# Patient Record
Sex: Male | Born: 1960 | Race: Black or African American | Hispanic: No | Marital: Single | Smoking: Current every day smoker
Health system: Southern US, Community
[De-identification: ages and names within clinical notes are randomized; demographics above are authoritative.]

## PROBLEM LIST (undated history)

## (undated) DIAGNOSIS — R519 Headache, unspecified: Secondary | ICD-10-CM

## (undated) DIAGNOSIS — I10 Essential (primary) hypertension: Secondary | ICD-10-CM

## (undated) DIAGNOSIS — R51 Headache: Secondary | ICD-10-CM

## (undated) DIAGNOSIS — F32A Depression, unspecified: Secondary | ICD-10-CM

## (undated) DIAGNOSIS — K219 Gastro-esophageal reflux disease without esophagitis: Secondary | ICD-10-CM

## (undated) DIAGNOSIS — Z9109 Other allergy status, other than to drugs and biological substances: Secondary | ICD-10-CM

## (undated) DIAGNOSIS — F329 Major depressive disorder, single episode, unspecified: Secondary | ICD-10-CM

## (undated) DIAGNOSIS — M1990 Unspecified osteoarthritis, unspecified site: Secondary | ICD-10-CM

## (undated) DIAGNOSIS — G4730 Sleep apnea, unspecified: Secondary | ICD-10-CM

## (undated) HISTORY — PX: COLONOSCOPY: SHX174

## (undated) HISTORY — PX: BACK SURGERY: SHX140

---

## 2007-04-10 ENCOUNTER — Encounter: Admission: RE | Admit: 2007-04-10 | Discharge: 2007-04-10 | Payer: Self-pay | Admitting: Neurosurgery

## 2007-04-30 ENCOUNTER — Inpatient Hospital Stay (HOSPITAL_COMMUNITY): Admission: RE | Admit: 2007-04-30 | Discharge: 2007-05-02 | Payer: Self-pay | Admitting: Neurosurgery

## 2007-05-06 ENCOUNTER — Emergency Department (HOSPITAL_COMMUNITY): Admission: EM | Admit: 2007-05-06 | Discharge: 2007-05-06 | Payer: Self-pay | Admitting: Emergency Medicine

## 2007-12-03 ENCOUNTER — Ambulatory Visit (HOSPITAL_COMMUNITY): Admission: RE | Admit: 2007-12-03 | Discharge: 2007-12-03 | Payer: Self-pay | Admitting: Orthopedic Surgery

## 2007-12-16 ENCOUNTER — Encounter: Admission: RE | Admit: 2007-12-16 | Discharge: 2007-12-16 | Payer: Self-pay | Admitting: Orthopedic Surgery

## 2008-03-22 ENCOUNTER — Encounter: Admission: RE | Admit: 2008-03-22 | Discharge: 2008-03-22 | Payer: Self-pay | Admitting: Neurosurgery

## 2010-09-02 ENCOUNTER — Encounter: Payer: Self-pay | Admitting: Neurosurgery

## 2010-12-24 NOTE — Op Note (Signed)
Jonathan Huber, Jonathan Huber              ACCOUNT NO.:  [id number]   MEDICAL RECORD NO.:  [id number]          PATIENT TYPE:  INP   LOCATION:  3172                         FACILITY:  [facility]   PHYSICIAN:  Reinaldo Meeker, M.D. DATE OF BIRTH:  1960-12-24   DATE OF PROCEDURE:  04/30/2007  DATE OF DISCHARGE:                               OPERATIVE REPORT   PREOPERATIVE DIAGNOSIS:  Spondylolisthesis with spondylolysis L4-5.   POSTOPERATIVE DIAGNOSIS:  Spondylolisthesis with spondylolysis L4-5.   PROCEDURE:  L4-5 Gill procedure with posterior lumbar interbody fusion  with invasive bony spacer and PEEK interbody cage, followed by L4-5  nonsegmental pedicle screw instrumentation followed by L4-5  posterolateral fusion.   SECONDARY PROCEDURE:  Microsection L4-L5 nerve roots and L4-5 disk  bilaterally.   SURGEON:  Reinaldo Meeker, M.D.   ASSISTANT:  Dr. Donalee Citrin.   PROCEDURE IN DETAIL:  After being placed in the prone position, the  patient's back was prepped and draped in usual sterile fashion.  Localizing fluoroscopy was used prior to incision.  Midline incision was  then made over the spinous process of L3, L4-L5.  Using Bovie cutting  current, the incision was carried down the spinous processes.  Subperiosteal dissection was then carried out bilaterally on the spinous  processes, lamina of facet joint and to the far lateral region to  identify the transverse processes of L4-L5 bilaterally.  Self retractor  was placed for exposure and x-ray showed approach to the appropriate  level.  Using the [facility] rongeur spinous process, interspinous ligaments  of L4-L5 were removed.  Started the patient's left side, free floating  lamina of L4 was removed along with the medial superior facet of L5.  L4-  L5 nerve roots were both identified and tracked out their lamina.  Midline structures were completely removed to complete the bilateral  decompression.  At this time, bilateral microdiskectomy was  carried out.  Thorough disk space clean-out was carried out, while at the same time  great care was taken to avoid injury to the neural elements.  This was  successfully done.  Bilateral L4-L5 nerve roots were found to be well  decompressed after the diskectomy.  At this time the disk space was  prepared for posterior lumbar interbody fusion.  A 12 mm distractor was  placed, found to be a good fit.  Rotating cutter followed by box chisel  was carried out bilaterally.  On one side an invasive bony spacer was  placed, on the opposite side a PEEK cage filled with autologous bone,  Actifuse and BMP was placed.  Prior to placing the second spacer,  autologous bone, Actifuse and BMP were placed in the midline to help  with the interbody fusion.  Fluoroscopy showed the devices to be in good  apposition at the interspace.  At this time pedicle screw fixation was  carried out.  Under fluoroscopic guidance, drill hole entry point was  made followed by using awl, a tap and placing of 40 mm screw on one side  at L4 and 45 mm screws on the left at L4 and 40 mm screws  were placed L5  bilaterally.  Fluoroscopy showed this to be in good position along with  palpation of the medial pedicle to confirm no breech of the pedicle.  Large amounts of irrigation were carried at this time.  High-speed drill  was used to decorticate the transverse processes and posterolateral  fusion was carried out by placing BMP autologous bone and Actifuse  bilaterally.  An appropriate length rod was then chosen and secured to  the pedicle screws.  After the L5 screws had been final tightened,  compression was carried out at L4 down to L5 and then final tightening  was done at L4.  Final fluoroscopy showed at this time the screws, rod  and interbody devices to all be in good position.  Large amounts of  irrigation were carried at this time once more.  Any bleeding controlled  with bipolar coagulation and Gelfoam.  Epidural drain  was left in the  epidural space and brought out through a  separate stab wound incision.  Wound was then closed in multiple layers  of Vicryl in the muscle fascia, subcutaneous and subcuticular tissues  and staples were placed on the skin.  Sterile dressing was applied.  The  patient was extubated and taken to the recovery room in stable  condition.           ______________________________  Reinaldo Meeker, M.D.     ROK/MEDQ  D:  04/30/2007  T:  04/30/2007  Job:  [id number]

## 2011-05-22 LAB — CBC
HCT: 45.3
Hemoglobin: 15.9
MCHC: 34.7
MCHC: 35
MCV: 88.3
Platelets: 362
RBC: 4.52
RDW: 13.8

## 2011-05-22 LAB — I-STAT 8, (EC8 V) (CONVERTED LAB)
BUN: 8
Chloride: 104
Glucose, Bld: 102 — ABNORMAL HIGH
Potassium: 4.1
pH, Ven: 7.361 — ABNORMAL HIGH

## 2011-05-22 LAB — DIFFERENTIAL
Basophils Relative: 0
Eosinophils Absolute: 0.1
Eosinophils Relative: 1
Monocytes Relative: 6
Neutrophils Relative %: 67

## 2011-05-22 LAB — URINALYSIS, ROUTINE W REFLEX MICROSCOPIC
Hgb urine dipstick: NEGATIVE
Ketones, ur: NEGATIVE
Protein, ur: NEGATIVE
Urobilinogen, UA: 1

## 2011-05-22 LAB — POCT I-STAT CREATININE: Operator id: 277751

## 2014-06-06 ENCOUNTER — Other Ambulatory Visit: Payer: Self-pay | Admitting: Neurosurgery

## 2014-06-06 DIAGNOSIS — M5412 Radiculopathy, cervical region: Secondary | ICD-10-CM

## 2014-06-14 ENCOUNTER — Ambulatory Visit
Admission: RE | Admit: 2014-06-14 | Discharge: 2014-06-14 | Disposition: A | Discharge status: Home / Self Care | Payer: Medicare Other | Source: Ambulatory Visit | Attending: Neurosurgery | Admitting: Neurosurgery

## 2014-06-14 DIAGNOSIS — M5412 Radiculopathy, cervical region: Secondary | ICD-10-CM

## 2014-10-04 ENCOUNTER — Other Ambulatory Visit: Payer: Self-pay | Admitting: Neurosurgery

## 2014-10-04 DIAGNOSIS — M5416 Radiculopathy, lumbar region: Secondary | ICD-10-CM

## 2014-10-20 ENCOUNTER — Other Ambulatory Visit: Payer: Medicare Other

## 2014-10-23 ENCOUNTER — Ambulatory Visit
Admission: RE | Admit: 2014-10-23 | Discharge: 2014-10-23 | Disposition: A | Discharge status: Home / Self Care | Payer: Medicare Other | Source: Ambulatory Visit | Attending: Neurosurgery | Admitting: Neurosurgery

## 2014-10-23 DIAGNOSIS — M5416 Radiculopathy, lumbar region: Secondary | ICD-10-CM

## 2014-10-23 MED ORDER — GADOBENATE DIMEGLUMINE 529 MG/ML IV SOLN
17.0000 mL | Freq: Once | INTRAVENOUS | Status: AC | PRN
  Administered 2014-10-23: 17 mL via INTRAVENOUS

## 2014-10-24 ENCOUNTER — Other Ambulatory Visit: Payer: Self-pay | Admitting: Neurosurgery

## 2014-10-24 DIAGNOSIS — M5416 Radiculopathy, lumbar region: Secondary | ICD-10-CM

## 2014-10-27 ENCOUNTER — Ambulatory Visit
Admission: RE | Admit: 2014-10-27 | Discharge: 2014-10-27 | Disposition: A | Discharge status: Home / Self Care | Payer: Medicare Other | Source: Ambulatory Visit | Attending: Neurosurgery | Admitting: Neurosurgery

## 2014-10-27 DIAGNOSIS — M5416 Radiculopathy, lumbar region: Secondary | ICD-10-CM

## 2014-10-27 MED ORDER — IOHEXOL 180 MG/ML  SOLN
1.0000 mL | Freq: Once | INTRAMUSCULAR | Status: AC | PRN
  Administered 2014-10-27: 1 mL via EPIDURAL

## 2014-10-27 MED ORDER — METHYLPREDNISOLONE ACETATE 40 MG/ML INJ SUSP (RADIOLOG
120.0000 mg | Freq: Once | INTRAMUSCULAR | Status: AC
  Administered 2014-10-27: 120 mg via EPIDURAL

## 2014-10-27 NOTE — Discharge Instructions (Signed)

## 2014-11-01 DIAGNOSIS — M25551 Pain in right hip: Secondary | ICD-10-CM | POA: Diagnosis present

## 2014-11-01 DIAGNOSIS — M5417 Radiculopathy, lumbosacral region: Secondary | ICD-10-CM | POA: Diagnosis not present

## 2014-11-01 DIAGNOSIS — Z720 Tobacco use: Secondary | ICD-10-CM | POA: Diagnosis not present

## 2014-11-01 DIAGNOSIS — R000 Tachycardia, unspecified: Secondary | ICD-10-CM | POA: Diagnosis not present

## 2014-11-01 DIAGNOSIS — Z9889 Other specified postprocedural states: Secondary | ICD-10-CM | POA: Insufficient documentation

## 2014-11-01 DIAGNOSIS — I10 Essential (primary) hypertension: Secondary | ICD-10-CM | POA: Insufficient documentation

## 2014-11-02 ENCOUNTER — Encounter (HOSPITAL_COMMUNITY): Payer: Self-pay | Admitting: Emergency Medicine

## 2014-11-02 ENCOUNTER — Emergency Department (HOSPITAL_COMMUNITY)
Admission: EM | Admit: 2014-11-02 | Discharge: 2014-11-02 | Disposition: A | Discharge status: Home / Self Care | Payer: Medicare Other | Attending: Emergency Medicine | Admitting: Emergency Medicine

## 2014-11-02 DIAGNOSIS — M5417 Radiculopathy, lumbosacral region: Secondary | ICD-10-CM | POA: Diagnosis not present

## 2014-11-02 HISTORY — DX: Essential (primary) hypertension: I10

## 2014-11-02 MED ORDER — METHOCARBAMOL 500 MG PO TABS: 500.0000 mg | ORAL_TABLET | Freq: Two times a day (BID) | ORAL | Status: AC

## 2014-11-02 MED ORDER — TRAMADOL HCL 50 MG PO TABS: 50.0000 mg | ORAL_TABLET | Freq: Four times a day (QID) | ORAL | Status: AC | PRN

## 2014-11-02 MED ORDER — KETOROLAC TROMETHAMINE 60 MG/2ML IM SOLN
60.0000 mg | Freq: Once | INTRAMUSCULAR | Status: AC
Start: 2014-11-02 — End: 2014-11-02
  Administered 2014-11-02: 60 mg via INTRAMUSCULAR
  Filled 2014-11-02: qty 2

## 2014-11-02 MED ORDER — OXYCODONE-ACETAMINOPHEN 5-325 MG PO TABS
2.0000 | ORAL_TABLET | Freq: Once | ORAL | Status: AC
  Administered 2014-11-02: 2 via ORAL
  Filled 2014-11-02: qty 2

## 2014-11-02 NOTE — Discharge Instructions (Signed)

## 2014-11-02 NOTE — ED Notes (Signed)
Pt st's no relief after Toradol IM.  Fayrene HelperBowie Tran, PA made aware.

## 2014-11-02 NOTE — ED Notes (Signed)
Patient here with complaint of right hip pain. States history of lower back surgery which relieved nerve pressure and allowed him to return to function for a number of years. States not no particular injury that he can recall. States pain is similar to pain prior to spinal surgery. Patient has been taking Rx oral pain medications and using topical analgesics with no relief.

## 2014-11-02 NOTE — ED Provider Notes (Signed)
CSN: [id number]     Arrival date & time 11/01/14  2356 History   First MD Initiated Contact with Patient 11/02/14 0038     Chief Complaint  Patient presents with  . Hip Pain     (Consider location/radiation/quality/duration/timing/severity/associated sxs/prior Treatment) HPI   54 year old male who presents for evaluation of right hip pain. Patient reports he has a significant history of back pain with prior back surgery to relieve pressure. States he was symptom-free for approximately 8 years. For the past 2 weeks he has had intermittent right-sided hip pain and low back pain that radiates to the lateral aspects of his right lower leg. Pain is worsened with certain movement, sharp, shooting, and intense that time. States pain sometimes so severe it causes him to fall down on the ground. States he has had 4 separate episodes of fall because of his pain. He denies any specific injury. He was seen by his neurosurgeon, Dr. Gerlene FeeKritzer on 3/14 for this complaint. He did have a low back MRI that shows degenerative changes but no evidence of cord compression. He also had blood work done and states that it was normal. He was discharge with a steroid Dosepak which he currently taking but noticed no improvement. He did attempt to call Dr. Gerlene FeeKritzer for close follow-up but was not given any appointment date. Pain intensified tonight prompting him to come to the ER for further evaluation. He has been taking over-the-counter medication as well as BenGay without adequate relief. No complaints of fever, chills, abdominal pain, bowel bladder incontinence, saddle anesthesia, or rash. Denies any numbness but does endorse weakness to his right lower leg. No specific injury. No prior history of PE or DVT, no recent surgery, prolonged bed rest, calf pain or leg swelling, active cancer or hemoptysis. No chest pain or shortness of breath.  Past Medical History  Diagnosis Date  . Hypertension    Past Surgical History   Procedure Laterality Date  . Back surgery     No family history on file. History  Substance Use Topics  . Smoking status: Current Every Day Smoker -- 0.25 packs/day    Types: Cigarettes  . Smokeless tobacco: Not on file  . Alcohol Use: No    Review of Systems  All other systems reviewed and are negative.     Allergies  Review of patient's allergies indicates no known allergies.  Home Medications   Prior to Admission medications   Not on File   BP 140/109 mmHg  Pulse 119  Temp(Src) 98.5 F (36.9 C) (Oral)  Resp 16  SpO2 96% Physical Exam  Constitutional: He appears well-developed and well-nourished. No distress.  HENT:  Head: Atraumatic.  Eyes: Conjunctivae are normal.  Neck: Normal range of motion. Neck supple.  Cardiovascular:  Tachycardia without murmurs or gallops.  Pulmonary/Chest: Effort normal and breath sounds normal.  Abdominal: Soft. Bowel sounds are normal. He exhibits no distension. There is no tenderness.  Musculoskeletal: He exhibits tenderness (tenderness to right lumbosacral region on palpation and tenderness along the lateral aspects of right thigh, knee, and lower leg without overlying skin changes. Full range of motion throughout lower extremities. Intact distal pulse).  Neurological: He is alert.  Skin: No rash noted.  Psychiatric: He has a normal mood and affect.    ED Course  Procedures (including critical care time)  Patient here with radicular right lower leg pain. He has had prior back surgery with an L4-L5 fusion. He recently had a L-spine MRI performed 10 days  ago for his complaint which did not show evidence of cord compression or nerve impingement. He is able to ambulate, and is neurovascularly intact. He is currently taking a steroid Dosepak. He will need to follow-up promptly with his neurosurgeon for further management. Will provide pain management at this time. Labs Review Labs Reviewed - No data to display  Imaging Review No  results found.   EKG Interpretation None      MDM   Final diagnoses:  Lumbosacral radiculopathy    BP 145/81 mmHg  Pulse 74  Temp(Src) 98.5 F (36.9 C) (Oral)  Resp 18  SpO2 100%     Fayrene Helper, PA-C 11/02/14 0149  Marisa Severin, MD 11/02/14 [phone]

## 2014-11-02 NOTE — ED Notes (Signed)
Pt st's he started having pain in right hip on Sat. Tonight pain started going down right leg.

## 2014-11-03 ENCOUNTER — Other Ambulatory Visit: Payer: Self-pay | Admitting: Neurosurgery

## 2014-11-03 DIAGNOSIS — M5416 Radiculopathy, lumbar region: Secondary | ICD-10-CM

## 2014-11-09 ENCOUNTER — Ambulatory Visit
Admission: RE | Admit: 2014-11-09 | Discharge: 2014-11-09 | Disposition: A | Discharge status: Home / Self Care | Payer: Medicare Other | Source: Ambulatory Visit | Attending: Neurosurgery | Admitting: Neurosurgery

## 2014-11-09 DIAGNOSIS — M5416 Radiculopathy, lumbar region: Secondary | ICD-10-CM

## 2014-11-09 MED ORDER — ONDANSETRON HCL 4 MG/2ML IJ SOLN
4.0000 mg | Freq: Once | INTRAMUSCULAR | Status: AC
  Administered 2014-11-09: 4 mg via INTRAMUSCULAR

## 2014-11-09 MED ORDER — IOHEXOL 180 MG/ML  SOLN
13.0000 mL | Freq: Once | INTRAMUSCULAR | Status: AC | PRN
  Administered 2014-11-09: 13 mL via INTRATHECAL

## 2014-11-09 MED ORDER — MEPERIDINE HCL 100 MG/ML IJ SOLN
100.0000 mg | Freq: Once | INTRAMUSCULAR | Status: AC
  Administered 2014-11-09: 100 mg via INTRAMUSCULAR

## 2014-11-09 MED ORDER — DIAZEPAM 5 MG PO TABS
10.0000 mg | ORAL_TABLET | Freq: Once | ORAL | Status: AC
  Administered 2014-11-09: 10 mg via ORAL

## 2014-11-09 NOTE — Discharge Instructions (Signed)
Myelogram Discharge Instructions  1. Go home and rest quietly for the next 24 hours.  It is important to lie flat for the next 24 hours.  Get up only to go to the restroom.  You may lie in the bed or on a couch on your back, your stomach, your left side or your right side.  You may have one pillow under your head.  You may have pillows between your knees while you are on your side or under your knees while you are on your back.  2. DO NOT drive today.  Recline the seat as far back as it will go, while still wearing your seat belt, on the way home.  3. You may get up to go to the bathroom as needed.  You may sit up for 10 minutes to eat.  You may resume your normal diet and medications unless otherwise indicated.  Drink lots of extra fluids today and tomorrow.  4. The incidence of headache, nausea, or vomiting is about 5% (one in 20 patients).  If you develop a headache, lie flat and drink plenty of fluids until the headache goes away.  Caffeinated beverages may be helpful.  If you develop severe nausea and vomiting or a headache that does not go away with flat bed rest, call [phone].  5. You may resume normal activities after your 24 hours of bed rest is over; however, do not exert yourself strongly or do any heavy lifting tomorrow. If when you get up you have a headache when standing, go back to bed and force fluids for another 24 hours.  6. Call your physician for a follow-up appointment.  The results of your myelogram will be sent directly to your physician by the following day.  7. If you have any questions or if complications develop after you arrive home, please call [phone].  Discharge instructions have been explained to the patient.  The patient, or the person responsible for the patient, fully understands these instructions.       May resume Tramadol and Cymbalta on November 10, 2014, after 3 PM.

## 2014-11-09 NOTE — Progress Notes (Signed)
Patient states he has been off Cymbalta and Tramadol for the past two days.  Jonathan SievertJeanne Donshay Lupinski, RN

## 2014-11-10 ENCOUNTER — Telehealth: Payer: Self-pay | Admitting: Radiology

## 2014-11-10 NOTE — Telephone Encounter (Signed)
Pt had myelo at 3 pm yesterday. Explained he needed to stay on bedrest until this afternoon and could take all of his meds except those listed on his discharge instructions to begin again this afternoon. Also, explained he should not go out this evening if he had a headache when he got up or developed one after he was up for a while.

## 2014-11-10 NOTE — Telephone Encounter (Signed)
Pt has called multiple times this morning re: myelo discharge instructions. I have explained that he is not to do any activity other than bedrest or going to the bathroom until his 24 hours is up this afternoon. He also knows he can get up for 10 minutes for his meal. Explained if he did not stay on bedrest, he ran the risk of getting a very severe headache. Stated he understood. Explained other activities needed to wait until the bedrest was complete.

## 2014-11-15 ENCOUNTER — Other Ambulatory Visit: Payer: Medicare Other

## 2014-11-17 ENCOUNTER — Other Ambulatory Visit: Payer: Self-pay | Admitting: Neurosurgery

## 2014-11-24 NOTE — Pre-Procedure Instructions (Signed)
Jonathan PengJohnny D Huber  11/24/2014   Your procedure is scheduled on:  Thurs, April 21 @ 7:30 AM  [facility]  at 5:30 AM.  Call this number if you have problems the morning of surgery: [phone]   Remember:   Do not eat food or drink liquids after midnight.   Take these medicines the morning of surgery with A SIP OF WATER: Duloxetine(Cymbalta),Flonase(Fluticasone),Omeprazole(Prilosec),Pain Pill(if needed),and Eye Drops              Stop taking your Ibuprofen and any Herbal Medications or Vitamins. No Goody's,BC's,Aleve,Aspirin,or Fish Oil.    Do not wear jewelry.  Do not wear lotions, powders, or colognes. You may wear deodorant.  Men may shave face and neck.  Do not bring valuables to the hospital.  [facility] is not responsible                  for any belongings or valuables.               Contacts, dentures or bridgework may not be worn into surgery.  Leave suitcase in the car. After surgery it may be brought to your room.  For patients admitted to the hospital, discharge time is determined by your                treatment team.                   Special Instructions:  [place] - Preparing for Surgery  Before surgery, you can play an important role.  Because skin is not sterile, your skin needs to be as free of germs as possible.  You can reduce the number of germs on you skin by washing with CHG (chlorahexidine gluconate) soap before surgery.  CHG is an antiseptic cleaner which kills germs and bonds with the skin to continue killing germs even after washing.  Please DO NOT use if you have an allergy to CHG or antibacterial soaps.  If your skin becomes reddened/irritated stop using the CHG and inform your nurse when you arrive at Short Stay.  Do not shave (including legs and underarms) for at least 48 hours prior to the first CHG shower.  You may shave your face.  Please follow these instructions carefully:   1.  Shower with CHG Soap the night before  surgery and the                                morning of Surgery.  2.  If you choose to wash your hair, wash your hair first as usual with your       normal shampoo.  3.  After you shampoo, rinse your hair and body thoroughly to remove the                      Shampoo.  4.  Use CHG as you would any other liquid soap.  You can apply chg directly       to the skin and wash gently with scrungie or a clean washcloth.  5.  Apply the CHG Soap to your body ONLY FROM THE NECK DOWN.        Do not use on open wounds or open sores.  Avoid contact with your eyes,       ears, mouth and genitals (private parts).  Wash genitals (private parts)  with your normal soap.  6.  Wash thoroughly, paying special attention to the area where your surgery        will be performed.  7.  Thoroughly rinse your body with warm water from the neck down.  8.  DO NOT shower/wash with your normal soap after using and rinsing off       the CHG Soap.  9.  Pat yourself dry with a clean towel.            10.  Wear clean pajamas.            11.  Place clean sheets on your bed the night of your first shower and do not        sleep with pets.  Day of Surgery  Do not apply any lotions/deoderants the morning of surgery.  Please wear clean clothes to the hospital/surgery center.     Please read over the following fact sheets that you were given: Pain Booklet, Coughing and Deep Breathing, Blood Transfusion Information, MRSA Information and Surgical Site Infection Prevention

## 2014-11-27 ENCOUNTER — Encounter (HOSPITAL_COMMUNITY)
Admission: RE | Admit: 2014-11-27 | Discharge: 2014-11-27 | Disposition: A | Discharge status: Home / Self Care | Payer: Medicare Other | Source: Ambulatory Visit | Attending: Neurosurgery | Admitting: Neurosurgery

## 2014-11-27 ENCOUNTER — Encounter (HOSPITAL_COMMUNITY): Payer: Self-pay

## 2014-11-27 DIAGNOSIS — M4807 Spinal stenosis, lumbosacral region: Secondary | ICD-10-CM | POA: Diagnosis not present

## 2014-11-27 DIAGNOSIS — M79604 Pain in right leg: Secondary | ICD-10-CM | POA: Diagnosis not present

## 2014-11-27 HISTORY — DX: Gastro-esophageal reflux disease without esophagitis: K21.9

## 2014-11-27 HISTORY — DX: Headache: R51

## 2014-11-27 HISTORY — DX: Unspecified osteoarthritis, unspecified site: M19.90

## 2014-11-27 HISTORY — DX: Other allergy status, other than to drugs and biological substances: Z91.09

## 2014-11-27 HISTORY — DX: Depression, unspecified: F32.A

## 2014-11-27 HISTORY — DX: Headache, unspecified: R51.9

## 2014-11-27 HISTORY — DX: Major depressive disorder, single episode, unspecified: F32.9

## 2014-11-27 HISTORY — DX: Sleep apnea, unspecified: G47.30

## 2014-11-27 LAB — BASIC METABOLIC PANEL
Anion gap: 12 (ref 5–15)
BUN: 12 mg/dL (ref 6–23)
CALCIUM: 9.6 mg/dL (ref 8.4–10.5)
CO2: 21 mmol/L (ref 19–32)
Chloride: 102 mmol/L (ref 96–112)
Creatinine, Ser: 1.04 mg/dL (ref 0.50–1.35)
GFR calc Af Amer: >90 mL/min (ref >90)
GFR, EST NON AFRICAN AMERICAN: 81 mL/min — AB (ref >90)
GLUCOSE: 104 mg/dL — AB (ref 70–99)
Potassium: 4.2 mmol/L (ref 3.5–5.1)
Sodium: 135 mmol/L (ref 135–145)

## 2014-11-27 LAB — CBC
HCT: 50 % (ref 39.0–52.0)
Hemoglobin: 17.9 g/dL — ABNORMAL HIGH (ref 13.0–17.0)
MCH: 31.6 pg (ref 26.0–34.0)
MCHC: 35.8 g/dL (ref 30.0–36.0)
MCV: 88.3 fL (ref 78.0–100.0)
Platelets: 277 10*3/uL (ref 150–400)
RBC: 5.66 MIL/uL (ref 4.22–5.81)
RDW: 13.2 % (ref 11.5–15.5)
WBC: 6.8 10*3/uL (ref 4.0–10.5)

## 2014-11-27 LAB — SURGICAL PCR SCREEN
MRSA, PCR: NEGATIVE
Staphylococcus aureus: NEGATIVE

## 2014-11-27 LAB — TYPE AND SCREEN
ABO/RH(D): A POS
Antibody Screen: NEGATIVE

## 2014-11-27 NOTE — Progress Notes (Signed)
Pt was scheduled for a 9 AM PAT appt, had not arrived by 9:15 AM. Called pt and he states that he wasn't sure when is appt was and he was waiting on his ride who would be there around 9:30 AM and he would be here by 10:45 AM. Will see him then.

## 2014-11-27 NOTE — Pre-Procedure Instructions (Signed)
Jonathan Huber  11/27/2014   Your procedure is scheduled on: Thursday, November 30, 2014 at 7:30 AM.   [facility] Office at 5:30 AM.   Call this number if you have problems the morning of surgery: [phone]               Any questions prior to day of surgery, please call [phone] between 8 & 4 PM.    Remember:   Do not eat food or drink liquids after midnight Wednesday, 11/29/14.   Take these medicines the morning of surgery with A SIP OF WATER: Duloxetine (Cymbalta), Omeprazole (Prilosec), Pataday eye drops, Oxycodone - if needed, Flonase nasal spray - if needed.  Stop NSAIDS (Ibuprofen, Aleve, etc) as of today.   Do not wear jewelry.  Do not wear lotions, powders, or cologne. You may wear deodorant.  Men may shave face and neck.  Do not bring valuables to the hospital.  [facility] is not responsible                  for any belongings or valuables.               Contacts, dentures or bridgework may not be worn into surgery.  Leave suitcase in the car. After surgery it may be brought to your room.  For patients admitted to the hospital, discharge time is determined by your                treatment team.                 Special Instructions: [place] - Preparing for Surgery  Before surgery, you can play an important role.  Because skin is not sterile, your skin needs to be as free of germs as possible.  You can reduce the number of germs on you skin by washing with CHG (chlorahexidine gluconate) soap before surgery.  CHG is an antiseptic cleaner which kills germs and bonds with the skin to continue killing germs even after washing.  Please DO NOT use if you have an allergy to CHG or antibacterial soaps.  If your skin becomes reddened/irritated stop using the CHG and inform your nurse when you arrive at Short Stay.  Do not shave (including legs and underarms) for at least 48 hours prior to the first CHG shower.  You may shave your  face.  Please follow these instructions carefully:   1.  Shower with CHG Soap the night before surgery and the                                morning of Surgery.  2.  If you choose to wash your hair, wash your hair first as usual with your       normal shampoo.  3.  After you shampoo, rinse your hair and body thoroughly to remove the                      Shampoo.  4.  Use CHG as you would any other liquid soap.  You can apply chg directly       to the skin and wash gently with scrungie or a clean washcloth.  5.  Apply the CHG Soap to your body ONLY FROM THE NECK DOWN.        Do not use on open wounds or open sores.  Avoid contact with your eyes, ears, mouth and genitals (private parts).  Wash genitals (private parts) with your normal soap.  6.  Wash thoroughly, paying special attention to the area where your surgery        will be performed.  7.  Thoroughly rinse your body with warm water from the neck down.  8.  DO NOT shower/wash with your normal soap after using and rinsing off       the CHG Soap.  9.  Pat yourself dry with a clean towel.            10.  Wear clean pajamas.            11.  Place clean sheets on your bed the night of your first shower and do not        sleep with pets.  Day of Surgery  Do not apply any lotions the morning of surgery.  Please wear clean clothes to the hospital.     Please read over the following fact sheets that you were given: Pain Booklet, Coughing and Deep Breathing, Blood Transfusion Information, MRSA Information and Surgical Site Infection Prevention

## 2014-11-29 ENCOUNTER — Encounter (HOSPITAL_COMMUNITY): Payer: Self-pay | Admitting: Emergency Medicine

## 2014-11-29 ENCOUNTER — Other Ambulatory Visit: Payer: Self-pay

## 2014-11-29 ENCOUNTER — Emergency Department (HOSPITAL_COMMUNITY)
Admission: EM | Admit: 2014-11-29 | Discharge: 2014-11-29 | Disposition: A | Discharge status: Home / Self Care | Payer: Medicare Other | Source: Home / Self Care | Attending: Emergency Medicine | Admitting: Emergency Medicine

## 2014-11-29 DIAGNOSIS — I10 Essential (primary) hypertension: Secondary | ICD-10-CM | POA: Insufficient documentation

## 2014-11-29 DIAGNOSIS — Z720 Tobacco use: Secondary | ICD-10-CM | POA: Insufficient documentation

## 2014-11-29 DIAGNOSIS — Z8669 Personal history of other diseases of the nervous system and sense organs: Secondary | ICD-10-CM | POA: Insufficient documentation

## 2014-11-29 DIAGNOSIS — K219 Gastro-esophageal reflux disease without esophagitis: Secondary | ICD-10-CM | POA: Insufficient documentation

## 2014-11-29 DIAGNOSIS — F329 Major depressive disorder, single episode, unspecified: Secondary | ICD-10-CM | POA: Insufficient documentation

## 2014-11-29 DIAGNOSIS — Z01812 Encounter for preprocedural laboratory examination: Secondary | ICD-10-CM

## 2014-11-29 DIAGNOSIS — R110 Nausea: Secondary | ICD-10-CM | POA: Insufficient documentation

## 2014-11-29 DIAGNOSIS — E860 Dehydration: Secondary | ICD-10-CM

## 2014-11-29 DIAGNOSIS — Z79899 Other long term (current) drug therapy: Secondary | ICD-10-CM | POA: Insufficient documentation

## 2014-11-29 DIAGNOSIS — F17210 Nicotine dependence, cigarettes, uncomplicated: Secondary | ICD-10-CM | POA: Diagnosis present

## 2014-11-29 DIAGNOSIS — Z8739 Personal history of other diseases of the musculoskeletal system and connective tissue: Secondary | ICD-10-CM | POA: Insufficient documentation

## 2014-11-29 DIAGNOSIS — Z981 Arthrodesis status: Secondary | ICD-10-CM

## 2014-11-29 DIAGNOSIS — M4807 Spinal stenosis, lumbosacral region: Principal | ICD-10-CM | POA: Diagnosis present

## 2014-11-29 DIAGNOSIS — R000 Tachycardia, unspecified: Secondary | ICD-10-CM | POA: Insufficient documentation

## 2014-11-29 DIAGNOSIS — Z01818 Encounter for other preprocedural examination: Secondary | ICD-10-CM

## 2014-11-29 DIAGNOSIS — Z01810 Encounter for preprocedural cardiovascular examination: Secondary | ICD-10-CM

## 2014-11-29 DIAGNOSIS — G4730 Sleep apnea, unspecified: Secondary | ICD-10-CM | POA: Diagnosis present

## 2014-11-29 LAB — COMPREHENSIVE METABOLIC PANEL
ALBUMIN: 4.3 g/dL (ref 3.5–5.2)
ALT: 32 U/L (ref 0–53)
ANION GAP: 14 (ref 5–15)
AST: 27 U/L (ref 0–37)
Alkaline Phosphatase: 47 U/L (ref 39–117)
BILIRUBIN TOTAL: 1.5 mg/dL — AB (ref 0.3–1.2)
BUN: 23 mg/dL (ref 6–23)
CO2: 25 mmol/L (ref 19–32)
Calcium: 9.6 mg/dL (ref 8.4–10.5)
Chloride: 98 mmol/L (ref 96–112)
Creatinine, Ser: 1.4 mg/dL — ABNORMAL HIGH (ref 0.50–1.35)
GFR calc non Af Amer: 57 mL/min — ABNORMAL LOW (ref >90)
GFR, EST AFRICAN AMERICAN: 66 mL/min — AB (ref >90)
Glucose, Bld: 116 mg/dL — ABNORMAL HIGH (ref 70–99)
Potassium: 3.9 mmol/L (ref 3.5–5.1)
Sodium: 137 mmol/L (ref 135–145)
Total Protein: 7.8 g/dL (ref 6.0–8.3)

## 2014-11-29 LAB — I-STAT CHEM 8, ED
BUN: 24 mg/dL — AB (ref 6–23)
CALCIUM ION: 1.11 mmol/L — AB (ref 1.12–1.23)
Chloride: 101 mmol/L (ref 96–112)
Creatinine, Ser: 1 mg/dL (ref 0.50–1.35)
Glucose, Bld: 99 mg/dL (ref 70–99)
HEMATOCRIT: 49 % (ref 39.0–52.0)
Hemoglobin: 16.7 g/dL (ref 13.0–17.0)
Potassium: 3.8 mmol/L (ref 3.5–5.1)
SODIUM: 141 mmol/L (ref 135–145)
TCO2: 23 mmol/L (ref 0–100)

## 2014-11-29 LAB — CBC WITH DIFFERENTIAL/PLATELET
Basophils Absolute: 0 10*3/uL (ref 0.0–0.1)
Basophils Relative: 1 % (ref 0–1)
EOS ABS: 0 10*3/uL (ref 0.0–0.7)
EOS PCT: 0 % (ref 0–5)
HCT: 48.6 % (ref 39.0–52.0)
Hemoglobin: 17.7 g/dL — ABNORMAL HIGH (ref 13.0–17.0)
LYMPHS ABS: 3.4 10*3/uL (ref 0.7–4.0)
Lymphocytes Relative: 49 % — ABNORMAL HIGH (ref 12–46)
MCH: 31.2 pg (ref 26.0–34.0)
MCHC: 36.4 g/dL — ABNORMAL HIGH (ref 30.0–36.0)
MCV: 85.6 fL (ref 78.0–100.0)
MONOS PCT: 10 % (ref 3–12)
Monocytes Absolute: 0.7 10*3/uL (ref 0.1–1.0)
Neutro Abs: 2.9 10*3/uL (ref 1.7–7.7)
Neutrophils Relative %: 40 % — ABNORMAL LOW (ref 43–77)
PLATELETS: 307 10*3/uL (ref 150–400)
RBC: 5.68 MIL/uL (ref 4.22–5.81)
RDW: 12.8 % (ref 11.5–15.5)
WBC: 7 10*3/uL (ref 4.0–10.5)

## 2014-11-29 LAB — URINALYSIS, ROUTINE W REFLEX MICROSCOPIC
Glucose, UA: NEGATIVE mg/dL
Hgb urine dipstick: NEGATIVE
KETONES UR: 15 mg/dL — AB
Leukocytes, UA: NEGATIVE
NITRITE: NEGATIVE
PROTEIN: NEGATIVE mg/dL
Specific Gravity, Urine: 1.025 (ref 1.005–1.030)
Urobilinogen, UA: 0.2 mg/dL (ref 0.0–1.0)
pH: 5 (ref 5.0–8.0)

## 2014-11-29 LAB — LIPASE, BLOOD: LIPASE: 22 U/L (ref 11–59)

## 2014-11-29 LAB — I-STAT TROPONIN, ED: Troponin i, poc: 0 ng/mL (ref 0.00–0.08)

## 2014-11-29 MED ORDER — CEFAZOLIN SODIUM-DEXTROSE 2-3 GM-% IV SOLR
2.0000 g | INTRAVENOUS | Status: AC
  Administered 2014-11-30 (×2): 2 g via INTRAVENOUS

## 2014-11-29 MED ORDER — DEXAMETHASONE SODIUM PHOSPHATE 10 MG/ML IJ SOLN
10.0000 mg | INTRAMUSCULAR | Status: AC
  Administered 2014-11-30: 10 mg via INTRAVENOUS
  Filled 2014-11-29: qty 1

## 2014-11-29 MED ORDER — SODIUM CHLORIDE 0.9 % IV BOLUS (SEPSIS)
1000.0000 mL | INTRAVENOUS | Status: AC
  Administered 2014-11-29: 1000 mL via INTRAVENOUS

## 2014-11-29 NOTE — Discharge Instructions (Signed)
Please follow the directions provided. You may proceed as planned for your surgery tomorrow. Please drink plenty of fluids by mouth to stay well hydrated. Please stop taking your HCTZ until advised by her primary care doctor. Be sure to follow-up with your primary care doctor to ensure you're getting better. Don't hesitate to return for any new, worsening, or concerning symptoms.   SEEK IMMEDIATE MEDICAL CARE IF:  You are unable to keep fluids down or you get worse despite treatment.  You have frequent episodes of vomiting or diarrhea.  You have blood or green matter (bile) in your vomit.  You have blood in your stool or your stool looks black and tarry.  You have not urinated in 6 to 8 hours, or you have only urinated a small amount of very dark urine.  You have a fever.  You faint.

## 2014-11-29 NOTE — ED Provider Notes (Signed)
CSN: [id number]     Arrival date & time 11/29/14  1117 History   First MD Initiated Contact with Patient 11/29/14 1200     Chief Complaint  Patient presents with  . Nausea  . Headache  . Dizziness   (Consider location/radiation/quality/duration/timing/severity/associated sxs/prior Treatment) HPI Jonathan Huber is a 54 yo male presenting with lightheadedness, nausea and sweating. He states when he woke up this morning he noticed feeling lightheaded and nauseated. He thought it was because he was hungry and proceeded to eat breakfast with no significant improvement. He noticed the lightheadedness improves when he was lying down but got worse when he changed physicians. While he was at physical therapy, the nausea and lightheadedness got worse while he stood up, and at one point he felt sweaty as if he was going to pass out. He denies any chest pain at any time.  He also denies dizziness, visual changes, change in speech, unilateral weakness or recent illness or injury.  Past Medical History  Diagnosis Date  . Hypertension   . Sleep apnea     waiting for Cpap (as of 11/27/14)  . Depression   . GERD (gastroesophageal reflux disease)   . Headache     back pain causes migraine headaches (per pt))  . Arthritis     left knee  . Environmental allergies     eyes - uses Pataday and Refresh   Past Surgical History  Procedure Laterality Date  . Back surgery      L4-L5 fusion  . Colonoscopy      with polypectomy   Family History  Problem Relation Age of Onset  . Aneurysm Mother   . Parkinson's disease Father   . Prostate cancer Father    History  Substance Use Topics  . Smoking status: Current Every Day Smoker -- 0.25 packs/day    Types: Cigarettes  . Smokeless tobacco: Never Used  . Alcohol Use: No    Review of Systems  Constitutional: Positive for diaphoresis. Negative for fever and chills.  HENT: Negative for sore throat.   Eyes: Negative for visual disturbance.   Respiratory: Negative for cough and shortness of breath.   Cardiovascular: Negative for chest pain and leg swelling.  Gastrointestinal: Positive for nausea. Negative for vomiting and diarrhea.  Genitourinary: Negative for dysuria.  Musculoskeletal: Negative for myalgias.  Skin: Negative for rash.  Neurological: Positive for light-headedness. Negative for weakness, numbness and headaches.      Allergies  Review of patient's allergies indicates no known allergies.  Home Medications   Prior to Admission medications   Medication Sig Start Date End Date Taking? Authorizing Provider  Artificial Tear Ointment (REFRESH P.M. OP) Apply 1 drop to eye at bedtime. 1 drop in each eye    Historical Provider, MD  Diclofenac Sodium 1.5 % SOLN Place 1 each onto the skin 2 (two) times daily.    Historical Provider, MD  DULoxetine (CYMBALTA) 60 MG capsule Take 60 mg by mouth 2 (two) times daily.    Historical Provider, MD  ergocalciferol (VITAMIN D2) 50000 UNITS capsule Take 50,000 Units by mouth once a week. Take on Wednesday    Historical Provider, MD  fluticasone (FLONASE) 50 MCG/ACT nasal spray Place 1 spray into both nostrils 2 (two) times daily as needed for allergies.     Historical Provider, MD  hydrochlorothiazide (HYDRODIURIL) 25 MG tablet Take 25 mg by mouth daily. 09/17/14   Historical Provider, MD  ibuprofen (ADVIL,MOTRIN) 200 MG tablet Take 400 mg by  mouth every 6 (six) hours as needed (pain).    Historical Provider, MD  Magnesium Oxide -Mg Supplement 250 MG TABS Take 250 mg by mouth daily.  10/26/14   Historical Provider, MD  methocarbamol (ROBAXIN) 500 MG tablet Take 1 tablet (500 mg total) by mouth 2 (two) times daily. Patient not taking: Reported on 11/23/2014 11/02/14   Fayrene HelperBowie Tran, PA-C  omeprazole (PRILOSEC) 20 MG capsule Take 20 mg by mouth daily.    Historical Provider, MD  Oxycodone HCl 10 MG TABS Take 10 mg by mouth every 4 (four) hours as needed (pain).  11/15/14   Historical Provider,  MD  PATADAY 0.2 % SOLN Apply 1 drop to eye 2 (two) times daily.  11/14/14   Historical Provider, MD  traMADol (ULTRAM) 50 MG tablet Take 1 tablet (50 mg total) by mouth every 6 (six) hours as needed for moderate pain. Patient not taking: Reported on 11/23/2014 11/02/14   Fayrene HelperBowie Tran, PA-C   BP 107/86 mmHg  Pulse 118  Temp(Src) 98 F (36.7 C) (Oral)  Resp 15  Ht 5\' 8"  (1.727 m)  Wt 181 lb (82.101 kg)  BMI 27.53 kg/m2  SpO2 98% Physical Exam  Constitutional: He appears well-developed and well-nourished. No distress.  HENT:  Head: Normocephalic and atraumatic.  Mouth/Throat: Mucous membranes are dry.  Eyes: Conjunctivae are normal.  Neck: Neck supple. No thyromegaly present.  Cardiovascular: Regular rhythm and intact distal pulses.  Tachycardia present.   Pulmonary/Chest: Effort normal and breath sounds normal. No respiratory distress. He has no wheezes. He has no rales. He exhibits no tenderness.  Abdominal: Soft. There is no tenderness.  Musculoskeletal: He exhibits no tenderness.  Lymphadenopathy:    He has no cervical adenopathy.  Neurological: He is alert.  Skin: Skin is warm and dry. No rash noted. He is not diaphoretic.  Psychiatric: He has a normal mood and affect.  Nursing note and vitals reviewed.   ED Course  Procedures (including critical care time) Labs Review Labs Reviewed  COMPREHENSIVE METABOLIC PANEL - Abnormal; Notable for the following:    Glucose, Bld 116 (*)    Creatinine, Ser 1.40 (*)    Total Bilirubin 1.5 (*)    GFR calc non Af Amer 57 (*)    GFR calc Af Amer 66 (*)    All other components within normal limits  CBC WITH DIFFERENTIAL/PLATELET - Abnormal; Notable for the following:    Hemoglobin 17.7 (*)    MCHC 36.4 (*)    Neutrophils Relative % 40 (*)    Lymphocytes Relative 49 (*)    All other components within normal limits  URINALYSIS, ROUTINE W REFLEX MICROSCOPIC - Abnormal; Notable for the following:    Color, Urine AMBER (*)    Bilirubin Urine  SMALL (*)    Ketones, ur 15 (*)    All other components within normal limits  I-STAT CHEM 8, ED - Abnormal; Notable for the following:    BUN 24 (*)    Calcium, Ion 1.11 (*)    All other components within normal limits  LIPASE, BLOOD  I-STAT TROPOININ, ED    Imaging Review No results found.   EKG Interpretation   Date/Time:  Wednesday November 29 2014 11:51:48 EDT Ventricular Rate:  113 PR Interval:  129 QRS Duration: 77 QT Interval:  327 QTC Calculation: 448 R Axis:   65 Text Interpretation:  Sinus tachycardia Borderline repolarization  abnormality No old tracing to compare Confirmed by Ethelda ChickJACUBOWITZ  MD, SAM  [phone]([id number]) on  11/29/2014 12:43:09 PM      MDM   Final diagnoses:  Dehydration   54 yo with light-headedness and nausea worsening with position changes. He is tachycardic with dry mucous membranes on exam, and orthostatic changes in both heart rate and blood pressure consistent with dehydration secondary to diuretic use. CBC, CMP, Troponin, UA, EKG and 2L NS. Discussed case with Dr. Ethelda Chick.   Labs reviewed, mildly elevated creatinine: 1.4, on CMP, elevated hgb: 17.7 on CBC and ketones: 15 in urine consistent with dehydration. Pt's heart rate and symptoms resolved after IVF.  Discussed pt's presentation with Dr. Gerlene Fee, due to pt's scheduled surgery tomorrow.  He requests repeat chemistries after IVF and if creatinine has improved, pt may present for scheduled surgery in am.  2:58 PM: Reports a sharp chest pain lasting a few minutes and then completley resolving.  He states he had an identical pain in June 2015 and had an EKG, admission for chest pain rule out and a stress test at [facility] that was negative for cardiac etiology. His repeat EKG shows improved ventricular rate and is otherwise unchanged from previous. Doubt concerning cardiac etiology as cause of pain.  Pt is well-appearing, in no acute distress and vital signs reviewed and not concerning. He can  change position and ambulate in the ED without difficulty.  He appears safe to be discharged.  Discharge include follow-up with PCP.  Return precautions provided. Pt aware of plan and in agreement.    Filed Vitals:   11/29/14 1445 11/29/14 1515 11/29/14 1530 11/29/14 1600  BP: 133/79 125/86 131/83 134/87  Pulse: 88 94 90 91  Temp:      TempSrc:      Resp: Height:      Weight:      SpO2: 100% 80% 100% 100%   Meds given in ED:  Medications  sodium chloride 0.9 % bolus 1,000 mL (0 mLs Intravenous Stopped 11/29/14 1414)  sodium chloride 0.9 % bolus 1,000 mL (0 mLs Intravenous Stopped 11/29/14 1541)    Discharge Medication List as of 11/29/2014  4:04 PM         Harle Battiest, NP 11/30/14 1610  Doug Sou, MD 11/30/14 0700

## 2014-11-29 NOTE — ED Notes (Signed)
Pt was at physical therapy this morning for ankle, had some dizziness and nausea-- states "I think I waited to eat too late--" pt DENIES any numbness/tingling-- different than normal--- pt scheduled for back surgery -- dr Gerlene Feekritzer-- for spinal fusion.

## 2014-11-29 NOTE — ED Provider Notes (Signed)
Complaint of light headedness with standing onset 8:30 AM today. Patient had mild headache at onset of illness 8:30 AM which has since resolved. He denies any nausea. Denies noncompliance with his medications. He does suffer from chronic back pain and right ankle pain as result of prior skin strain and chronic lumbar radiculopathy. He denies abdominal pain denies chest pain. Lightheadedness worse with standing improve with lying down.  Doug SouSam Sahir Tolson, MD 11/29/14 [phone]

## 2014-11-30 ENCOUNTER — Inpatient Hospital Stay (HOSPITAL_COMMUNITY): Payer: Medicare Other | Admitting: Anesthesiology

## 2014-11-30 ENCOUNTER — Encounter (HOSPITAL_COMMUNITY)
Admission: RE | Disposition: A | Discharge status: Home / Self Care | Payer: Self-pay | Source: Ambulatory Visit | Attending: Neurosurgery

## 2014-11-30 ENCOUNTER — Inpatient Hospital Stay (HOSPITAL_COMMUNITY): Payer: Medicare Other

## 2014-11-30 ENCOUNTER — Inpatient Hospital Stay (HOSPITAL_COMMUNITY)
Admission: RE | Admit: 2014-11-30 | Discharge: 2014-12-03 | DRG: 460 | Disposition: A | Discharge status: Home / Self Care | Payer: Medicare Other | Source: Ambulatory Visit | Attending: Neurosurgery | Admitting: Neurosurgery

## 2014-11-30 ENCOUNTER — Encounter (HOSPITAL_COMMUNITY): Payer: Self-pay

## 2014-11-30 DIAGNOSIS — Z981 Arthrodesis status: Secondary | ICD-10-CM | POA: Diagnosis not present

## 2014-11-30 DIAGNOSIS — I10 Essential (primary) hypertension: Secondary | ICD-10-CM | POA: Diagnosis present

## 2014-11-30 DIAGNOSIS — Z01810 Encounter for preprocedural cardiovascular examination: Secondary | ICD-10-CM | POA: Diagnosis not present

## 2014-11-30 DIAGNOSIS — G4730 Sleep apnea, unspecified: Secondary | ICD-10-CM | POA: Diagnosis not present

## 2014-11-30 DIAGNOSIS — M79604 Pain in right leg: Secondary | ICD-10-CM | POA: Diagnosis present

## 2014-11-30 DIAGNOSIS — M48061 Spinal stenosis, lumbar region without neurogenic claudication: Secondary | ICD-10-CM | POA: Diagnosis present

## 2014-11-30 DIAGNOSIS — F17210 Nicotine dependence, cigarettes, uncomplicated: Secondary | ICD-10-CM | POA: Diagnosis present

## 2014-11-30 DIAGNOSIS — Z419 Encounter for procedure for purposes other than remedying health state, unspecified: Secondary | ICD-10-CM

## 2014-11-30 DIAGNOSIS — M4807 Spinal stenosis, lumbosacral region: Secondary | ICD-10-CM | POA: Diagnosis present

## 2014-11-30 DIAGNOSIS — K219 Gastro-esophageal reflux disease without esophagitis: Secondary | ICD-10-CM | POA: Diagnosis present

## 2014-11-30 DIAGNOSIS — Z01818 Encounter for other preprocedural examination: Secondary | ICD-10-CM | POA: Diagnosis not present

## 2014-11-30 DIAGNOSIS — Z01812 Encounter for preprocedural laboratory examination: Secondary | ICD-10-CM | POA: Diagnosis not present

## 2014-11-30 SURGERY — POSTERIOR LUMBAR FUSION 1 LEVEL
Anesthesia: General | Site: Spine Lumbar

## 2014-11-30 MED ORDER — HEMOSTATIC AGENTS (NO CHARGE) OPTIME
TOPICAL | Status: DC | PRN
  Administered 2014-11-30: 1 via TOPICAL

## 2014-11-30 MED ORDER — DOCUSATE SODIUM 100 MG PO CAPS
100.0000 mg | ORAL_CAPSULE | Freq: Two times a day (BID) | ORAL | Status: DC
  Administered 2014-11-30 – 2014-12-02 (×4): 100 mg via ORAL
  Filled 2014-11-30 (×7): qty 1

## 2014-11-30 MED ORDER — OXYCODONE-ACETAMINOPHEN 5-325 MG PO TABS
1.0000 | ORAL_TABLET | ORAL | Status: DC | PRN
  Administered 2014-11-30: 2 via ORAL
  Administered 2014-12-01 (×2): 1 via ORAL
  Administered 2014-12-02 – 2014-12-03 (×5): 2 via ORAL
  Filled 2014-11-30: qty 1
  Filled 2014-11-30: qty 2
  Filled 2014-11-30: qty 1
  Filled 2014-11-30 (×4): qty 2

## 2014-11-30 MED ORDER — MENTHOL 3 MG MT LOZG: 1.0000 | LOZENGE | OROMUCOSAL | Status: DC | PRN

## 2014-11-30 MED ORDER — SODIUM CHLORIDE 0.9 % IJ SOLN
3.0000 mL | Freq: Two times a day (BID) | INTRAMUSCULAR | Status: DC
  Administered 2014-11-30 – 2014-12-03 (×7): 3 mL via INTRAVENOUS

## 2014-11-30 MED ORDER — ARTIFICIAL TEARS OP OINT
TOPICAL_OINTMENT | OPHTHALMIC | Status: AC
  Filled 2014-11-30: qty 3.5

## 2014-11-30 MED ORDER — FENTANYL CITRATE (PF) 250 MCG/5ML IJ SOLN
INTRAMUSCULAR | Status: AC
  Filled 2014-11-30: qty 5

## 2014-11-30 MED ORDER — THROMBIN 5000 UNITS EX SOLR
CUTANEOUS | Status: DC | PRN
  Administered 2014-11-30 (×4): 5000 [IU] via TOPICAL

## 2014-11-30 MED ORDER — FENTANYL CITRATE (PF) 250 MCG/5ML IJ SOLN
INTRAMUSCULAR | Status: DC | PRN
  Administered 2014-11-30 (×2): 100 ug via INTRAVENOUS
  Administered 2014-11-30: 50 ug via INTRAVENOUS
  Administered 2014-11-30 (×2): 100 ug via INTRAVENOUS
  Administered 2014-11-30 (×2): 50 ug via INTRAVENOUS
  Administered 2014-11-30 (×2): 100 ug via INTRAVENOUS

## 2014-11-30 MED ORDER — SODIUM CHLORIDE 0.9 % IJ SOLN: 3.0000 mL | INTRAMUSCULAR | Status: DC | PRN

## 2014-11-30 MED ORDER — DEXAMETHASONE SODIUM PHOSPHATE 4 MG/ML IJ SOLN
4.0000 mg | Freq: Four times a day (QID) | INTRAMUSCULAR | Status: AC
  Administered 2014-11-30: 4 mg via INTRAVENOUS
  Filled 2014-11-30 (×2): qty 1

## 2014-11-30 MED ORDER — HYDROMORPHONE HCL 1 MG/ML IJ SOLN
INTRAMUSCULAR | Status: AC
  Administered 2014-11-30: 0.5 mg via INTRAVENOUS
  Filled 2014-11-30: qty 2

## 2014-11-30 MED ORDER — SODIUM CHLORIDE 0.9 % IJ SOLN
INTRAMUSCULAR | Status: AC
  Filled 2014-11-30: qty 10

## 2014-11-30 MED ORDER — BACITRACIN 50000 UNITS IM SOLR
INTRAMUSCULAR | Status: DC | PRN
  Administered 2014-11-30: 500 mL

## 2014-11-30 MED ORDER — VECURONIUM BROMIDE 10 MG IV SOLR
INTRAVENOUS | Status: AC
  Filled 2014-11-30: qty 10

## 2014-11-30 MED ORDER — 0.9 % SODIUM CHLORIDE (POUR BTL) OPTIME
TOPICAL | Status: DC | PRN
  Administered 2014-11-30: 1000 mL

## 2014-11-30 MED ORDER — PHENYLEPHRINE 40 MCG/ML (10ML) SYRINGE FOR IV PUSH (FOR BLOOD PRESSURE SUPPORT)
PREFILLED_SYRINGE | INTRAVENOUS | Status: AC
  Filled 2014-11-30: qty 10

## 2014-11-30 MED ORDER — GLYCOPYRROLATE 0.2 MG/ML IJ SOLN
INTRAMUSCULAR | Status: DC | PRN
  Administered 2014-11-30: .7 mg via INTRAVENOUS

## 2014-11-30 MED ORDER — GLYCOPYRROLATE 0.2 MG/ML IJ SOLN
INTRAMUSCULAR | Status: AC
  Filled 2014-11-30: qty 3

## 2014-11-30 MED ORDER — HYDROMORPHONE HCL 1 MG/ML IJ SOLN
0.2500 mg | INTRAMUSCULAR | Status: DC | PRN
  Administered 2014-11-30 (×4): 0.5 mg via INTRAVENOUS

## 2014-11-30 MED ORDER — BACITRACIN ZINC 500 UNIT/GM EX OINT
TOPICAL_OINTMENT | CUTANEOUS | Status: DC | PRN
  Administered 2014-11-30: 1 via TOPICAL

## 2014-11-30 MED ORDER — METHOCARBAMOL 500 MG PO TABS
ORAL_TABLET | ORAL | Status: AC
  Administered 2014-11-30: 500 mg via ORAL
  Filled 2014-11-30: qty 1

## 2014-11-30 MED ORDER — ONDANSETRON HCL 4 MG/2ML IJ SOLN
INTRAMUSCULAR | Status: AC
  Filled 2014-11-30: qty 4

## 2014-11-30 MED ORDER — HYDROCHLOROTHIAZIDE 25 MG PO TABS
25.0000 mg | ORAL_TABLET | Freq: Every day | ORAL | Status: DC
  Administered 2014-11-30 – 2014-12-03 (×4): 25 mg via ORAL
  Filled 2014-11-30 (×4): qty 1

## 2014-11-30 MED ORDER — KCL IN DEXTROSE-NACL 20-5-0.45 MEQ/L-%-% IV SOLN
80.0000 mL/h | INTRAVENOUS | Status: DC
  Administered 2014-11-30: 80 mL/h via INTRAVENOUS
  Filled 2014-11-30: qty 1000

## 2014-11-30 MED ORDER — ACETAMINOPHEN 325 MG PO TABS
650.0000 mg | ORAL_TABLET | ORAL | Status: DC | PRN
  Administered 2014-12-01 – 2014-12-02 (×4): 650 mg via ORAL
  Filled 2014-11-30 (×4): qty 2

## 2014-11-30 MED ORDER — LACTATED RINGERS IV SOLN
INTRAVENOUS | Status: DC | PRN
  Administered 2014-11-30 (×3): via INTRAVENOUS

## 2014-11-30 MED ORDER — BISACODYL 5 MG PO TBEC: 5.0000 mg | DELAYED_RELEASE_TABLET | Freq: Every day | ORAL | Status: DC | PRN

## 2014-11-30 MED ORDER — ACETAMINOPHEN 10 MG/ML IV SOLN
INTRAVENOUS | Status: AC
  Administered 2014-11-30: 1000 mg via INTRAVENOUS
  Filled 2014-11-30: qty 100

## 2014-11-30 MED ORDER — ROCURONIUM BROMIDE 50 MG/5ML IV SOLN
INTRAVENOUS | Status: AC
  Filled 2014-11-30: qty 2

## 2014-11-30 MED ORDER — ROCURONIUM BROMIDE 100 MG/10ML IV SOLN
INTRAVENOUS | Status: DC | PRN
  Administered 2014-11-30: 50 mg via INTRAVENOUS

## 2014-11-30 MED ORDER — LIDOCAINE HCL (CARDIAC) 20 MG/ML IV SOLN
INTRAVENOUS | Status: AC
  Filled 2014-11-30: qty 10

## 2014-11-30 MED ORDER — OLOPATADINE HCL 0.1 % OP SOLN
1.0000 [drp] | Freq: Two times a day (BID) | OPHTHALMIC | Status: DC
Start: 2014-11-30 — End: 2014-12-03
  Administered 2014-11-30 – 2014-12-03 (×7): 1 [drp] via OPHTHALMIC
  Filled 2014-11-30: qty 5

## 2014-11-30 MED ORDER — MIDAZOLAM HCL 5 MG/5ML IJ SOLN
INTRAMUSCULAR | Status: DC | PRN
  Administered 2014-11-30: 2 mg via INTRAVENOUS

## 2014-11-30 MED ORDER — NEOSTIGMINE METHYLSULFATE 10 MG/10ML IV SOLN
INTRAVENOUS | Status: AC
  Filled 2014-11-30: qty 2

## 2014-11-30 MED ORDER — VECURONIUM BROMIDE 10 MG IV SOLR
INTRAVENOUS | Status: DC | PRN
  Administered 2014-11-30: 1 mg via INTRAVENOUS
  Administered 2014-11-30 (×3): 2 mg via INTRAVENOUS

## 2014-11-30 MED ORDER — HYDROMORPHONE HCL 1 MG/ML IJ SOLN: 1.0000 mg | INTRAMUSCULAR | Status: DC | PRN

## 2014-11-30 MED ORDER — ACETAMINOPHEN 650 MG RE SUPP
650.0000 mg | RECTAL | Status: DC | PRN
Start: 2014-11-30 — End: 2014-12-03

## 2014-11-30 MED ORDER — DEXAMETHASONE 4 MG PO TABS: 4.0000 mg | ORAL_TABLET | Freq: Four times a day (QID) | ORAL | Status: AC

## 2014-11-30 MED ORDER — DULOXETINE HCL 60 MG PO CPEP
60.0000 mg | ORAL_CAPSULE | Freq: Two times a day (BID) | ORAL | Status: DC
  Administered 2014-11-30: 60 mg via ORAL
  Filled 2014-11-30 (×4): qty 1

## 2014-11-30 MED ORDER — PHENYLEPHRINE HCL 10 MG/ML IJ SOLN
INTRAMUSCULAR | Status: DC | PRN
  Administered 2014-11-30 (×4): 80 ug via INTRAVENOUS

## 2014-11-30 MED ORDER — ONDANSETRON HCL 4 MG/2ML IJ SOLN: 4.0000 mg | INTRAMUSCULAR | Status: DC | PRN

## 2014-11-30 MED ORDER — EPHEDRINE SULFATE 50 MG/ML IJ SOLN
INTRAMUSCULAR | Status: AC
  Filled 2014-11-30: qty 1

## 2014-11-30 MED ORDER — ACETAMINOPHEN 10 MG/ML IV SOLN
1000.0000 mg | Freq: Once | INTRAVENOUS | Status: AC
  Administered 2014-11-30: 1000 mg via INTRAVENOUS

## 2014-11-30 MED ORDER — STERILE WATER FOR INJECTION IJ SOLN
INTRAMUSCULAR | Status: AC
  Filled 2014-11-30: qty 10

## 2014-11-30 MED ORDER — METHOCARBAMOL 1000 MG/10ML IJ SOLN
500.0000 mg | Freq: Four times a day (QID) | INTRAVENOUS | Status: DC | PRN
  Filled 2014-11-30: qty 5

## 2014-11-30 MED ORDER — PROMETHAZINE HCL 25 MG/ML IJ SOLN: 6.2500 mg | INTRAMUSCULAR | Status: DC | PRN

## 2014-11-30 MED ORDER — CEFAZOLIN SODIUM-DEXTROSE 2-3 GM-% IV SOLR
2.0000 g | Freq: Three times a day (TID) | INTRAVENOUS | Status: AC
  Administered 2014-11-30 (×2): 2 g via INTRAVENOUS
  Filled 2014-11-30 (×2): qty 50

## 2014-11-30 MED ORDER — PANTOPRAZOLE SODIUM 40 MG IV SOLR
40.0000 mg | Freq: Every day | INTRAVENOUS | Status: DC
  Administered 2014-11-30 – 2014-12-02 (×3): 40 mg via INTRAVENOUS
  Filled 2014-11-30 (×3): qty 40

## 2014-11-30 MED ORDER — OXYCODONE-ACETAMINOPHEN 5-325 MG PO TABS
ORAL_TABLET | ORAL | Status: AC
  Administered 2014-11-30: 2 via ORAL
  Filled 2014-11-30: qty 2

## 2014-11-30 MED ORDER — MIDAZOLAM HCL 2 MG/2ML IJ SOLN
INTRAMUSCULAR | Status: AC
  Filled 2014-11-30: qty 2

## 2014-11-30 MED ORDER — METHOCARBAMOL 500 MG PO TABS
500.0000 mg | ORAL_TABLET | Freq: Four times a day (QID) | ORAL | Status: DC | PRN
  Administered 2014-11-30 – 2014-12-02 (×4): 500 mg via ORAL
  Filled 2014-11-30 (×4): qty 1

## 2014-11-30 MED ORDER — MEPERIDINE HCL 25 MG/ML IJ SOLN: 6.2500 mg | INTRAMUSCULAR | Status: DC | PRN

## 2014-11-30 MED ORDER — ONDANSETRON HCL 4 MG/2ML IJ SOLN
INTRAMUSCULAR | Status: DC | PRN
  Administered 2014-11-30: 4 mg via INTRAVENOUS

## 2014-11-30 MED ORDER — PHENOL 1.4 % MT LIQD: 1.0000 | OROMUCOSAL | Status: DC | PRN

## 2014-11-30 MED ORDER — CEFAZOLIN SODIUM-DEXTROSE 2-3 GM-% IV SOLR
INTRAVENOUS | Status: AC
  Filled 2014-11-30: qty 50

## 2014-11-30 MED ORDER — BUPIVACAINE HCL (PF) 0.5 % IJ SOLN
INTRAMUSCULAR | Status: DC | PRN
  Administered 2014-11-30: 20 mL

## 2014-11-30 MED ORDER — PROMETHAZINE HCL 25 MG/ML IJ SOLN
INTRAMUSCULAR | Status: AC
  Filled 2014-11-30: qty 1

## 2014-11-30 MED ORDER — NEOSTIGMINE METHYLSULFATE 10 MG/10ML IV SOLN
INTRAVENOUS | Status: DC | PRN
  Administered 2014-11-30: 4 mg via INTRAVENOUS

## 2014-11-30 MED ORDER — SODIUM CHLORIDE 0.9 % IV SOLN: 250.0000 mL | INTRAVENOUS | Status: DC

## 2014-11-30 MED ORDER — LIDOCAINE HCL (CARDIAC) 20 MG/ML IV SOLN
INTRAVENOUS | Status: DC | PRN
  Administered 2014-11-30: 100 mg via INTRAVENOUS

## 2014-11-30 MED ORDER — ARTIFICIAL TEARS OP OINT
TOPICAL_OINTMENT | OPHTHALMIC | Status: DC | PRN
  Administered 2014-11-30: 1 via OPHTHALMIC

## 2014-11-30 SURGICAL SUPPLY — 67 items
BAG DECANTER FOR FLEXI CONT (MISCELLANEOUS) ×3 IMPLANT
BENZOIN TINCTURE PRP APPL 2/3 (GAUZE/BANDAGES/DRESSINGS) ×3 IMPLANT
BLADE CLIPPER SURG (BLADE) IMPLANT
BONE EQUIVA 10CC (Bone Implant) ×3 IMPLANT
BRUSH SCRUB EZ PLAIN DRY (MISCELLANEOUS) ×3 IMPLANT
BUR CUTTER 7.0 ROUND (BURR) ×6 IMPLANT
BUR MATCHSTICK NEURO 3.0 LAGG (BURR) ×6 IMPLANT
CANISTER SUCT 3000ML PPV (MISCELLANEOUS) ×3 IMPLANT
CLOSURE WOUND 1/2 X4 (GAUZE/BANDAGES/DRESSINGS)
CONT SPEC 4OZ CLIKSEAL STRL BL (MISCELLANEOUS) ×6 IMPLANT
COVER BACK TABLE 60X90IN (DRAPES) ×3 IMPLANT
DRAPE C-ARM 42X72 X-RAY (DRAPES) ×6 IMPLANT
DRAPE LAPAROTOMY 100X72X124 (DRAPES) ×3 IMPLANT
DRAPE SURG 17X23 STRL (DRAPES) ×6 IMPLANT
DRSG OPSITE POSTOP 4X6 (GAUZE/BANDAGES/DRESSINGS) IMPLANT
DRSG OPSITE POSTOP 4X8 (GAUZE/BANDAGES/DRESSINGS) ×3 IMPLANT
DRSG TELFA 3X8 NADH (GAUZE/BANDAGES/DRESSINGS) IMPLANT
DURAPREP 26ML APPLICATOR (WOUND CARE) ×3 IMPLANT
ELECT REM PT RETURN 9FT ADLT (ELECTROSURGICAL) ×3
ELECTRODE REM PT RTRN 9FT ADLT (ELECTROSURGICAL) ×1 IMPLANT
EVACUATOR 1/8 PVC DRAIN (DRAIN) ×3 IMPLANT
GAUZE SPONGE 4X4 12PLY STRL (GAUZE/BANDAGES/DRESSINGS) IMPLANT
GAUZE SPONGE 4X4 16PLY XRAY LF (GAUZE/BANDAGES/DRESSINGS) IMPLANT
GLOVE BIO SURGEON STRL SZ7 (GLOVE) ×9 IMPLANT
GLOVE BIOGEL PI IND STRL 7.5 (GLOVE) ×3 IMPLANT
GLOVE BIOGEL PI INDICATOR 7.5 (GLOVE) ×6
GLOVE ECLIPSE 7.5 STRL STRAW (GLOVE) ×3 IMPLANT
GLOVE ECLIPSE 8.0 STRL XLNG CF (GLOVE) ×6 IMPLANT
GLOVE EXAM NITRILE LRG STRL (GLOVE) IMPLANT
GLOVE EXAM NITRILE MD LF STRL (GLOVE) IMPLANT
GLOVE EXAM NITRILE XS STR PU (GLOVE) IMPLANT
GLOVE SURG SS PI 7.0 STRL IVOR (GLOVE) ×6 IMPLANT
GOWN STRL REUS W/ TWL LRG LVL3 (GOWN DISPOSABLE) IMPLANT
GOWN STRL REUS W/ TWL XL LVL3 (GOWN DISPOSABLE) ×6 IMPLANT
GOWN STRL REUS W/TWL 2XL LVL3 (GOWN DISPOSABLE) IMPLANT
GOWN STRL REUS W/TWL LRG LVL3 (GOWN DISPOSABLE)
GOWN STRL REUS W/TWL XL LVL3 (GOWN DISPOSABLE) ×12
IMPLANT ARDIS PEEK 10X9X26 (Orthopedic Implant) ×6 IMPLANT
KIT BASIN OR (CUSTOM PROCEDURE TRAY) ×3 IMPLANT
KIT ROOM TURNOVER OR (KITS) ×3 IMPLANT
LIQUID BAND (GAUZE/BANDAGES/DRESSINGS) IMPLANT
MILL MEDIUM DISP (BLADE) ×3 IMPLANT
NEEDLE HYPO 22GX1.5 SAFETY (NEEDLE) ×3 IMPLANT
NS IRRIG 1000ML POUR BTL (IV SOLUTION) ×3 IMPLANT
PACK LAMINECTOMY NEURO (CUSTOM PROCEDURE TRAY) ×3 IMPLANT
PAD ARMBOARD 7.5X6 YLW CONV (MISCELLANEOUS) ×15 IMPLANT
PATTIES SURGICAL .75X.75 (GAUZE/BANDAGES/DRESSINGS) IMPLANT
PEDIGUARD CURV (INSTRUMENTS) ×3 IMPLANT
ROD PATHFINDER 40MM (Rod) ×3 IMPLANT
ROD PATHFINDER PERC .45MM (Rod) ×3 IMPLANT
SCREW MIN INVASIVE 6.5X35 (Screw) ×6 IMPLANT
SCREW PATHFINDER 7.5X40MM (Screw) ×6 IMPLANT
SPONGE LAP 4X18 X RAY DECT (DISPOSABLE) IMPLANT
SPONGE SURGIFOAM ABS GEL 100 (HEMOSTASIS) ×3 IMPLANT
STRIP CLOSURE SKIN 1/2X4 (GAUZE/BANDAGES/DRESSINGS) IMPLANT
SUT PROLENE 0 CT 1 30 (SUTURE) ×3 IMPLANT
SUT VIC AB 0 CT1 18XCR BRD8 (SUTURE) ×1 IMPLANT
SUT VIC AB 0 CT1 8-18 (SUTURE) ×2
SUT VIC AB 2-0 OS6 18 (SUTURE) ×9 IMPLANT
SUT VIC AB 3-0 CP2 18 (SUTURE) ×6 IMPLANT
SYR 20ML ECCENTRIC (SYRINGE) ×3 IMPLANT
TOP CLSR SEQUOIA (Orthopedic Implant) ×12 IMPLANT
TOWEL OR 17X24 6PK STRL BLUE (TOWEL DISPOSABLE) ×3 IMPLANT
TOWEL OR 17X26 10 PK STRL BLUE (TOWEL DISPOSABLE) ×3 IMPLANT
TRAP SPECIMEN MUCOUS 40CC (MISCELLANEOUS) ×3 IMPLANT
TRAY FOLEY CATH 14FRSI W/METER (CATHETERS) ×3 IMPLANT
WATER STERILE IRR 1000ML POUR (IV SOLUTION) ×3 IMPLANT

## 2014-11-30 NOTE — Anesthesia Postprocedure Evaluation (Signed)
Anesthesia Post Note  Patient: Jonathan Huber  Procedure(s) Performed: Procedure(s) (LRB): POSTERIOR LUMBAR INTERBODY FUSION LUMBAR FIVE-SACRAL ONE WITH EXTENSION OF INSTRUMENTATION WITH HARDWARE REMOVEAL. (N/A)  Anesthesia type: General  Patient location: PACU  Post pain: Pain level controlled  Post assessment: Post-op Vital signs reviewed  Last Vitals: BP 121/79 mmHg  Pulse 81  Temp(Src) 36.9 C (Oral)  Resp 17  Ht 5\' 8"  (1.727 m)  Wt 181 lb (82.101 kg)  BMI 27.53 kg/m2  SpO2 100%  Post vital signs: Reviewed  Level of consciousness: sedated  Complications: No apparent anesthesia complications

## 2014-11-30 NOTE — Progress Notes (Signed)
Orthopedic Tech Progress Note Patient Details:  Jonathan Huber 04/22/1961 [id number]  Patient ID: Jonathan PengJohnny D Matty, male   DOB: 06/30/1961, 54 y.o.   MRN: [id number] RN stated that aspen lumbar fusion brace is already in pt's room.  Alyss Granato 11/30/2014, 3:00 PM

## 2014-11-30 NOTE — H&P (Signed)
Jonathan Huber is an 54 y.o. male.   Chief Complaint: Back and right leg pain HPI: The patient is a 54 year old gentleman who was evaluated in the office for back and right leg pain. He had low back fusion many years ago and did well. He initially came back last October with upper extremity issues and had a cervical procedure at that time. Once he recovered from that he began to develop severe right lower extremity pain that did not improve with conservative therapy. 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 After discussing the options and failing further conservative efforts the patient requested surgery now comes for extension of his fusion down to L5-S1. I've had a long discussion with him regarding the risks and benefits of surgical intervention. The risks discussed include but are not limited to bleeding infection weakness some as paralysis spinal fluid leak trouble with instrumentation nonunion coma and death. We have discussed alternative methods of therapy offered risks and benefits of nonintervention. He's had the opportunity to ask numerous questions and appears to understand. With this information in hand he has requested that we proceed with surgery.  Past Medical History  Diagnosis Date  . Hypertension   . Sleep apnea     waiting for Cpap (as of 11/27/14)  . Depression   . GERD (gastroesophageal reflux disease)   . Headache     back pain causes migraine headaches (per pt))  . Arthritis     left knee  . Environmental allergies     eyes - uses Pataday and Refresh    Past Surgical History  Procedure Laterality Date  . Back surgery      L4-L5 fusion  . Colonoscopy      with polypectomy    Family History  Problem Relation Age of Onset  . Aneurysm Mother   . Parkinson's disease Father   . Prostate cancer Father    Social History:  reports that he has been smoking Cigarettes.  He has  been smoking about 0.25 packs per day. He has never used smokeless tobacco. He reports that he does not drink alcohol or use illicit drugs.  Allergies: No Known Allergies  Medications Prior to Admission  Medication Sig Dispense Refill  . Artificial Tear Ointment (REFRESH P.M. OP) Place 1 drop into both eyes at bedtime. 1 drop in each eye    . DULoxetine (CYMBALTA) 60 MG capsule Take 60 mg by mouth 2 (two) times daily.    . ergocalciferol (VITAMIN D2) 50000 UNITS capsule Take 50,000 Units by mouth once a week. Take on Wednesday    . fluticasone (FLONASE) 50 MCG/ACT nasal spray Place 1 spray into both nostrils 2 (two) times daily as needed for allergies.     . hydrochlorothiazide (HYDRODIURIL) 25 MG tablet Take 25 mg by mouth daily.  2  . ibuprofen (ADVIL,MOTRIN) 200 MG tablet Take 400 mg by mouth every 6 (six) hours as needed (pain).    Marland Kitchen MAGNESIUM PO Take 1 tablet by mouth daily.    Marland Kitchen omeprazole (PRILOSEC) 20 MG capsule Take 20 mg by mouth daily.    . Oxycodone HCl 10 MG TABS Take 10 mg by mouth every 4 (four) hours as needed (pain).   0  . PATADAY 0.2 % SOLN Place 1 drop into both eyes 2 (two) times daily.   3  . methocarbamol (ROBAXIN) 500 MG tablet Take 1 tablet (500 mg total) by  mouth 2 (two) times daily. (Patient not taking: Reported on 11/23/2014) 20 tablet 0  . traMADol (ULTRAM) 50 MG tablet Take 1 tablet (50 mg total) by mouth every 6 (six) hours as needed for moderate pain. (Patient not taking: Reported on 11/23/2014) 15 tablet 0    Results for orders placed or performed during the hospital encounter of 11/29/14 (from the past 48 hour(s))  Lipase, blood     Status: None   Collection Time: 11/29/14 12:14 PM  Result Value Ref Range   Lipase 22 11 - 59 U/L  Comprehensive metabolic panel     Status: Abnormal   Collection Time: 11/29/14 12:14 PM  Result Value Ref Range   Sodium 137 135 - 145 mmol/L   Potassium 3.9 3.5 - 5.1 mmol/L   Chloride 98 96 - 112 mmol/L   CO2 25 19 - 32 mmol/L    Glucose, Bld 116 (H) 70 - 99 mg/dL   BUN 23 6 - 23 mg/dL   Creatinine, Ser 1.40 (H) 0.50 - 1.35 mg/dL   Calcium 9.6 8.4 - 10.5 mg/dL   Total Protein 7.8 6.0 - 8.3 g/dL   Albumin 4.3 3.5 - 5.2 g/dL   AST 27 0 - 37 U/L   ALT 32 0 - 53 U/L   Alkaline Phosphatase 47 39 - 117 U/L   Total Bilirubin 1.5 (H) 0.3 - 1.2 mg/dL   GFR calc non Af Amer 57 (L) >90 mL/min   GFR calc Af Amer 66 (L) >90 mL/min    Comment: (NOTE) The eGFR has been calculated using the CKD EPI equation. This calculation has not been validated in all clinical situations. eGFR's persistently <90 mL/min signify possible Chronic Kidney Disease.    Anion gap 14 5 - 15  CBC with Differential     Status: Abnormal   Collection Time: 11/29/14 12:14 PM  Result Value Ref Range   WBC 7.0 4.0 - 10.5 K/uL   RBC 5.68 4.22 - 5.81 MIL/uL   Hemoglobin 17.7 (H) 13.0 - 17.0 g/dL   HCT 48.6 39.0 - 52.0 %   MCV 85.6 78.0 - 100.0 fL   MCH 31.2 26.0 - 34.0 pg   MCHC 36.4 (H) 30.0 - 36.0 g/dL   RDW 12.8 11.5 - 15.5 %   Platelets 307 150 - 400 K/uL   Neutrophils Relative % 40 (L) 43 - 77 %   Neutro Abs 2.9 1.7 - 7.7 K/uL   Lymphocytes Relative 49 (H) 12 - 46 %   Lymphs Abs 3.4 0.7 - 4.0 K/uL   Monocytes Relative 10 3 - 12 %   Monocytes Absolute 0.7 0.1 - 1.0 K/uL   Eosinophils Relative 0 0 - 5 %   Eosinophils Absolute 0.0 0.0 - 0.7 K/uL   Basophils Relative 1 0 - 1 %   Basophils Absolute 0.0 0.0 - 0.1 K/uL  Urinalysis, Routine w reflex microscopic     Status: Abnormal   Collection Time: 11/29/14 12:29 PM  Result Value Ref Range   Color, Urine AMBER (A) YELLOW    Comment: BIOCHEMICALS MAY BE AFFECTED BY COLOR   APPearance CLEAR CLEAR   Specific Gravity, Urine 1.025 1.005 - 1.030   pH 5.0 5.0 - 8.0   Glucose, UA NEGATIVE NEGATIVE mg/dL   Hgb urine dipstick NEGATIVE NEGATIVE   Bilirubin Urine SMALL (A) NEGATIVE   Ketones, ur 15 (A) NEGATIVE mg/dL   Protein, ur NEGATIVE NEGATIVE mg/dL   Urobilinogen, UA 0.2 0.0 - 1.0 mg/dL  Nitrite NEGATIVE NEGATIVE   Leukocytes, UA NEGATIVE NEGATIVE    Comment: MICROSCOPIC NOT DONE ON URINES WITH NEGATIVE PROTEIN, BLOOD, LEUKOCYTES, NITRITE, OR GLUCOSE <1000 mg/dL.  I-stat troponin, ED     Status: None   Collection Time: 11/29/14 12:46 PM  Result Value Ref Range   Troponin i, poc 0.00 0.00 - 0.08 ng/mL   Comment 3            Comment: Due to the release kinetics of cTnI, a negative result within the first hours of the onset of symptoms does not rule out myocardial infarction with certainty. If myocardial infarction is still suspected, repeat the test at appropriate intervals.   I-stat chem 8, ed     Status: Abnormal   Collection Time: 11/29/14  3:44 PM  Result Value Ref Range   Sodium 141 135 - 145 mmol/L   Potassium 3.8 3.5 - 5.1 mmol/L   Chloride 101 96 - 112 mmol/L   BUN 24 (H) 6 - 23 mg/dL   Creatinine, Ser 1.00 0.50 - 1.35 mg/dL   Glucose, Bld 99 70 - 99 mg/dL   Calcium, Ion 1.11 (L) 1.12 - 1.23 mmol/L   TCO2 23 0 - 100 mmol/L   Hemoglobin 16.7 13.0 - 17.0 g/dL   HCT 49.0 39.0 - 52.0 %   No results found.  Positive for occasional chest pain or shortness of breath and inhalant allergies  Blood pressure 120/72, pulse 88, temperature 98 F (36.7 C), temperature source Oral, resp. rate 18, height 5' 8"  (1.727 m), weight 82.101 kg (181 lb), SpO2 100 %.  The patient is awake or and oriented. His no facial asymmetry. His gait is very slow and antalgic. 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 The plan is for extension of his fusion with extension of instrumentation.  Faythe Ghee, MD 11/30/2014, 7:26 AM

## 2014-11-30 NOTE — Op Note (Signed)
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 status post L4-5 fusion with central and lateral recess stenosis Postop diagnosis: Same Procedure: Bilateral L5-S1 decompressive laminectomy for relief of central and lateral stenosis and decompression of L5 and S1 nerve roots more so than needed for interbody fusion L5-S1 bilateral microdiscectomy L5-S1 posterior lumbar interbody fusion with 10 x 9 x 26 mm cages L5-S1 nonsegmental instrumentation with Pathfinder pedicle screw system L5-S1 posterolateral fusion Surgeon: Gonzalo Waymire Asst.: Jones  After being placed in the prone position the patient's back was prepped and draped in the usual sterile fashion. Previous lumbar incision was opened up and extended somewhat inferiorly. The incision was carried down to the spinous processes of L5 and S1 and subperiosteal dissection was then carried out bilaterally on the spinous processes lamina and facet joint. Dissection was carried out superior to identify the previous instrumentation at L4 and L5. Self-retaining tract was placed for exposure. After much work, the top loading nuts off of the field screws were removed followed by removing of the rod and removing of the screws. We did not replace the screws at L4 but we did put new Pathfinder screws at L5 which were 7.5 mm x 40 mm screws. We then confirmed appropriate level at L5-S1 did a generous laminectomy bilaterally by removing the inferior 80% of the L5 lamina the medial three quarters of the facet joint and the superior one third of the S1 segment. Residual bone and hypertrophic ligamentum flavum as well as reactive tissue on the dura were removed to complete the decompression. We identified and decompressed the L5 and S1 nerve roots bilaterally particularly on the right, more symptom Mattix side. This was done more so than needed for interbody fusion. We then identified the disc and thoroughly cleaned out with pituitary rongeurs and curettes.  Thorough displaced cleanout was carried out while the same time great care was taken to avoid injury to the neural elements and this was successfully done. The disc space was then distracted up to a 10 mm size this was felt to be a good choice. We prepared the disc space for interbody fusion and then impacted our first 10 x 9 x 26 Miller cage that was filled with a mixture of autologous bone and morselized allograft. We then prepared the opposite side and prior to placement second cage we placed the same mixture deep within the interspace to help with the interbody fusion. We then placed a second cage in good position which was confirmed by fluoroscopy. We then placed 6.5 x 35 mm screws at S1 bilaterally. We then irrigated copiously controlled any bleeding with upper coagulation and attached the rods to the top of the screws in standard fashion. We did tightening and final tightening with torque and counter torque and final fluoroscopy in AP lateral direction looked good. We then irrigated the wound once again controlled any bleeding with upper coagulation Gelfoam. We decorticated the far lateral region and placed a mixture of autologous bone morselized allograft to help with a posterior lateral fusion. We then left an epidural drain in the epidural out through separate stab wound incision. We then closed the wound in multiple layers of Vicryl on the muscle fascia subcutaneous and subcutaneous tissues. We did a running locking Prolene on the skin. A sterile dressing was then applied the patient was extubated and taken to recovery in stable condition.

## 2014-11-30 NOTE — Anesthesia Preprocedure Evaluation (Signed)
Anesthesia Evaluation  Patient identified by MRN, date of birth, ID band Patient awake    Reviewed: Allergy & Precautions, NPO status , Patient's Chart, lab work & pertinent test results  Airway Mallampati: II  TM Distance: >3 FB Neck ROM: Full    Dental no notable dental hx.    Pulmonary sleep apnea , Current Smoker,  breath sounds clear to auscultation  Pulmonary exam normal       Cardiovascular hypertension, Pt. on medications Rhythm:Regular Rate:Normal     Neuro/Psych  Headaches, PSYCHIATRIC DISORDERS Depression negative neurological ROS     GI/Hepatic Neg liver ROS, GERD-  ,  Endo/Other  negative endocrine ROS  Renal/GU negative Renal ROS     Musculoskeletal negative musculoskeletal ROS (+)   Abdominal   Peds  Hematology negative hematology ROS (+)   Anesthesia Other Findings   Reproductive/Obstetrics negative OB ROS                             Anesthesia Physical Anesthesia Plan  ASA: II  Anesthesia Plan: General   Post-op Pain Management:    Induction: Intravenous  Airway Management Planned: Oral ETT  Additional Equipment:   Intra-op Plan:   Post-operative Plan: Extubation in OR  Informed Consent: I have reviewed the patients History and Physical, chart, labs and discussed the procedure including the risks, benefits and alternatives for the proposed anesthesia with the patient or authorized representative who has indicated his/her understanding and acceptance.   Dental advisory given  Plan Discussed with: CRNA  Anesthesia Plan Comments:         Anesthesia Quick Evaluation

## 2014-11-30 NOTE — Transfer of Care (Signed)
Immediate Anesthesia Transfer of Care Note  Patient: Jonathan Huber  Procedure(s) Performed: Procedure(s): POSTERIOR LUMBAR INTERBODY FUSION LUMBAR FIVE-SACRAL ONE WITH EXTENSION OF INSTRUMENTATION WITH HARDWARE REMOVEAL. (N/A)  Patient Location: PACU  Anesthesia Type:General  Level of Consciousness: awake, alert  and oriented  Airway & Oxygen Therapy: Patient Spontanous Breathing and Patient connected to nasal cannula oxygen  Post-op Assessment: Report given to RN and Post -op Vital signs reviewed and stable  Post vital signs: Reviewed and stable  Last Vitals:  Filed Vitals:   11/30/14 0642  BP: 120/72  Pulse: 88  Temp: 36.7 C  Resp: 18    Complications: No apparent anesthesia complications

## 2014-12-01 MED ORDER — SENNA 8.6 MG PO TABS
1.0000 | ORAL_TABLET | Freq: Two times a day (BID) | ORAL | Status: DC
  Administered 2014-12-01 – 2014-12-03 (×4): 8.6 mg via ORAL
  Filled 2014-12-01 (×4): qty 1

## 2014-12-01 MED ORDER — PNEUMOCOCCAL VAC POLYVALENT 25 MCG/0.5ML IJ INJ
0.5000 mL | INJECTION | INTRAMUSCULAR | Status: AC
  Administered 2014-12-02: 0.5 mL via INTRAMUSCULAR
  Filled 2014-12-01: qty 0.5

## 2014-12-01 NOTE — Progress Notes (Signed)
UR complete.  Kateri Balch RN, MSN 

## 2014-12-01 NOTE — Progress Notes (Signed)
Patient ID: Jonathan PengJohnny D Huber, male   DOB: 07/24/1961, 54 y.o.   MRN: [id number] Afeb, vss No new neuro issues Incisional pain only and says it is not that severe. Leg pain much better than pre op. Drain working well. Will increase activity. Home either tomorrow or Sunday.

## 2014-12-01 NOTE — Progress Notes (Signed)
CARE MANAGEMENT NOTE 12/01/2014  Patient:  [name],Jerren D   Account Number:  [id number]  Date Initiated:  12/01/2014  Documentation initiated by:  Elmer BalesOBARGE,Anadia Helmes  Subjective/Objective Assessment:   Patient was admitted for a PLIF.     Action/Plan:   Will follow for discharge needs.   Anticipated DC Date:  12/02/2014   Anticipated DC Plan:  HOME/SELF CARE         Choice offered to / List presented to:             Status of service:  In process, will continue to follow Medicare Important Message given?   (If response is "NO", the following Medicare IM given date fields will be blank) Date Medicare IM given:   Medicare IM given by:   Date Additional Medicare IM given:   Additional Medicare IM given by:    Discharge Disposition:    Per UR Regulation:  Reviewed for med. necessity/level of care/duration of stay  If discussed at Long Length of Stay Meetings, dates discussed:    Comments:

## 2014-12-02 DIAGNOSIS — M4807 Spinal stenosis, lumbosacral region: Secondary | ICD-10-CM | POA: Diagnosis not present

## 2014-12-02 MED ORDER — OXYCODONE-ACETAMINOPHEN 5-325 MG PO TABS: 1.0000 | ORAL_TABLET | ORAL | Status: AC | PRN

## 2014-12-02 MED ORDER — METHOCARBAMOL 500 MG PO TABS: 500.0000 mg | ORAL_TABLET | Freq: Four times a day (QID) | ORAL | Status: AC | PRN

## 2014-12-02 NOTE — Progress Notes (Signed)
Patient ID: Jonathan Huber, male   DOB: 11/18/1960, 54 y.o.   MRN: [id number] Well Incision clean and dry Drain to be removed Ready for discharge

## 2014-12-02 NOTE — Discharge Summary (Signed)
Physician Discharge Summary  Patient ID: Jonathan Huber MRN: [id number] DOB/AGE: 52-17-1962 52 y.o.  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 He's been ambulating well. His incision is clean and dry. He is discharged home  Consults: None  Significant Diagnostic Studies: None  Treatments: L5-S1 decompression and arthrodesis pedicle screw fixation with posterior lateral arthrodesis L5-S1.  Discharge Exam: Blood pressure 131/81, pulse 95, temperature 98.7 F (37.1 C), temperature source Oral, resp. rate 18, height  (1.727 m), weight 82.101 kg (181 lb), SpO2 99 %. Incision is clean and dry. Station and gait are intact. Lower extremities. Are intact  Disposition: 01-Home or Self Care  Discharge Instructions    Call MD for:  redness, tenderness, or signs of infection (pain, swelling, redness, odor or green/yellow discharge around incision site)    Complete by:  As directed      Call MD for:  severe uncontrolled pain    Complete by:  As directed      Call MD for:  temperature >100.4    Complete by:  As directed      Diet - low sodium heart healthy    Complete by:  As directed      Increase activity slowly    Complete by:  As directed             Medication List    TAKE these medications        DULoxetine 60 MG capsule  Commonly known as:  CYMBALTA  Take 60 mg by mouth 2 (two) times daily.     ergocalciferol 50000 UNITS capsule  Commonly known as:  VITAMIN D2  Take 50,000 Units by mouth once a week. Take on Wednesday     fluticasone 50 MCG/ACT nasal spray  Commonly known as:  FLONASE  Place 1 spray into both nostrils 2  (two) times daily as needed for allergies.     hydrochlorothiazide 25 MG tablet  Commonly known as:  HYDRODIURIL  Take 25 mg by mouth daily.     ibuprofen 200 MG tablet  Commonly known as:  ADVIL,MOTRIN  Take 400 mg by mouth every 6 (six) hours as needed (pain).     MAGNESIUM PO  Take 1 tablet by mouth daily.     methocarbamol 500 MG tablet  Commonly known as:  ROBAXIN  Take 1 tablet (500 mg total) by mouth 2 (two) times daily.     methocarbamol 500 MG tablet  Commonly known as:  ROBAXIN  Take 1 tablet (500 mg total) by mouth every 6 (six) hours as needed for muscle spasms.     omeprazole 20 MG capsule  Commonly known as:  PRILOSEC  Take 20 mg by mouth daily.     Oxycodone HCl 10 MG Tabs  Take 10 mg by mouth every 4 (four) hours as needed (pain).     oxyCODONE-acetaminophen 5-325 MG per tablet  Commonly known as:  PERCOCET/ROXICET  Take 1-2 tablets by mouth every 4 (four) hours as needed for moderate pain.     PATADAY 0.2 % Soln  Generic drug:  Olopatadine HCl  Place 1 drop into both eyes 2 (two) times daily.     REFRESH P.M. OP  Place 1 drop  into both eyes at bedtime. 1 drop in each eye     traMADol 50 MG tablet  Commonly known as:  ULTRAM  Take 1 tablet (50 mg total) by mouth every 6 (six) hours as needed for moderate pain.         SignedStefani Dama: [name] 12/02/2014, 9:25 AM

## 2014-12-03 ENCOUNTER — Encounter (HOSPITAL_COMMUNITY): Payer: Self-pay | Admitting: General Practice

## 2014-12-03 NOTE — Care Management (Signed)
12/03/14 13:56 

## 2014-12-03 NOTE — Progress Notes (Signed)
Patient is being d/c home. D/c instruction given and patient verabalized understanding.

## 2014-12-03 NOTE — Progress Notes (Signed)
Subjective: Patient reports feeling better today  Objective: Vital signs in last 24 hours: Temp:  [98 F (36.7 C)-99.4 F (37.4 C)] 99.3 F (37.4 C) (04/24 0624) Pulse Rate:  [85-110] 104 (04/24 0624) Resp:  [18] 18 (04/24 0624) BP: (107-134)/(71-90) 122/82 mmHg (04/24 0624) SpO2:  [97 %-100 %] 98 % (04/24 0624)  Intake/Output from previous day: 04/23 0701 - 04/24 0700 In: 3 [I.V.:3] Out: 1340 [Urine:1150; Drains:190] Intake/Output this shift:    Physical Exam: Up and ambulating.  Less pain.  Wearing brace.  Lab Results: No results for input(s): WBC, HGB, HCT, PLT in the last 72 hours. BMET No results for input(s): NA, K, CL, CO2, GLUCOSE, BUN, CREATININE, CALCIUM in the last 72 hours.  Studies/Results: No results found.  Assessment/Plan: Discharge home.  Doing better today.    LOS: 3 days    Dorian HeckleSTERN,Kayvion Arneson D, MD 12/03/2014, 8:09 AM

## 2014-12-03 NOTE — Care Management Note (Signed)
    Page 1 of 1   12/03/2014     4:50:09 PM CARE MANAGEMENT NOTE 12/03/2014  Patient:  Jonathan PengBALDWIN,[name]   Account Number:  [id number]  Date Initiated:  12/01/2014  Documentation initiated by:  Elmer BalesOBARGE,COURTNEY  Subjective/Objective Assessment:   Patient was admitted for a PLIF.     Action/Plan:   Will follow for discharge needs.   Anticipated DC Date:  12/02/2014   Anticipated DC Plan:  HOME/SELF CARE      DC Planning Services  CM consult      Choice offered to / List presented to:     DME arranged  [facility]      DME agency  [facility]        Status of service:  Completed, signed off Medicare Important Message given?   (If response is "NO", the following Medicare IM given date fields will be blank) Date Medicare IM given:   Medicare IM given by:   Date Additional Medicare IM given:   Additional Medicare IM given by:    Discharge Disposition:  HOME/SELF CARE  Per UR Regulation:  Reviewed for med. necessity/level of care/duration of stay  If discussed at Long Length of Stay Meetings, dates discussed:    Comments:  12/03/14 15:00 CM received call from RN requesting rolling walker.  CMcalled [facility] DME rep, Fayrene FearingJames to please deliver rolling walker to room so pt can be discharged.  No other Cm needs were communicated.  Freddy JakschSarah Shley Dolby, BSN, CM [phone].

## 2015-06-10 IMAGING — CT CT L SPINE W/ CM
3 of 12 series · 11 of 33 positions shown, 13 images · non-contrast
Comparison: 10/23/2014 MR.

CLINICAL DATA: 53-year-old male with low back and right hip/ leg
pain. Surgery 0770. Subsequent encounter.
TECHNIQUE: Contiguous axial images were obtained through the Lumbar spine after
the intrathecal infusion of infusion. Coronal and sagittal
reconstructions were obtained of the axial image sets.

[Series 4: l spine bone · axial · 0.27mm/px · z∈[-399,-109]mm · 3 of 117 slices shown, 4 images]
[im 1/117  soft-tissue]
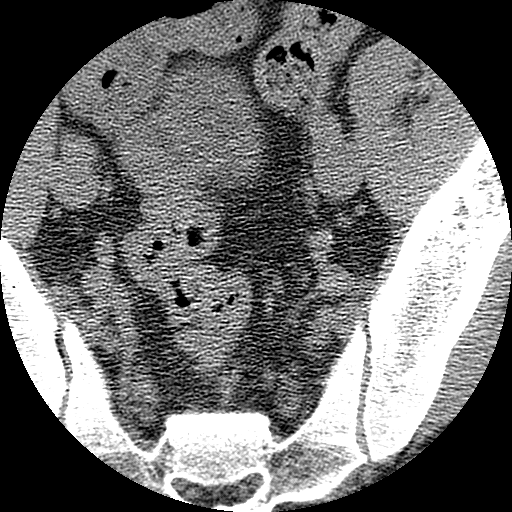
[im 1/117  bone]
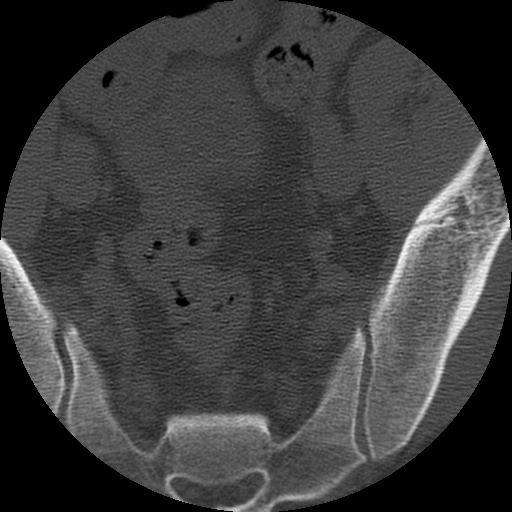
[im 59/117  bone]
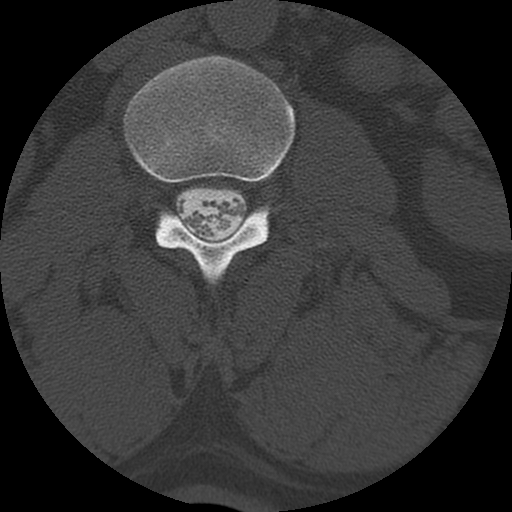
[im 117/117  bone]
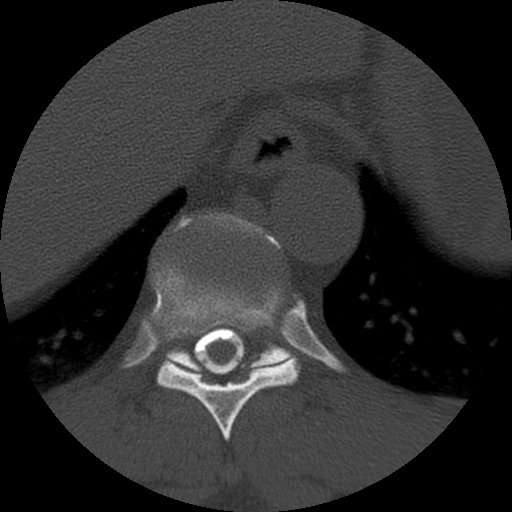

[Series 201: cor lower · coronal · 0.58mm/px · 3 of 60 slices shown]
[im 8/60  bone]
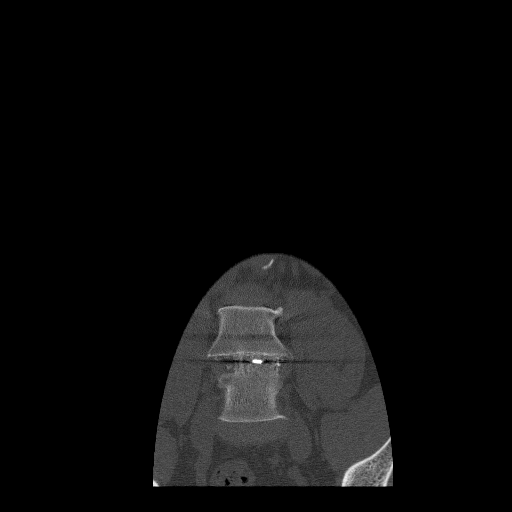
[im 24/60  bone]
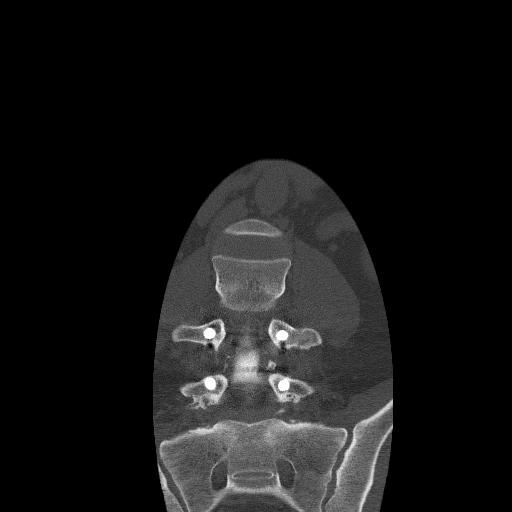
[im 40/60  bone]
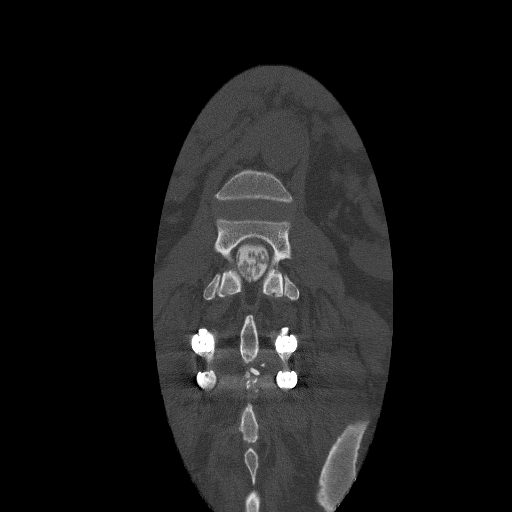

[Series 202: sag · sagittal · 0.58mm/px · 5 of 60 slices shown, 6 images]
[im 20/60  bone]
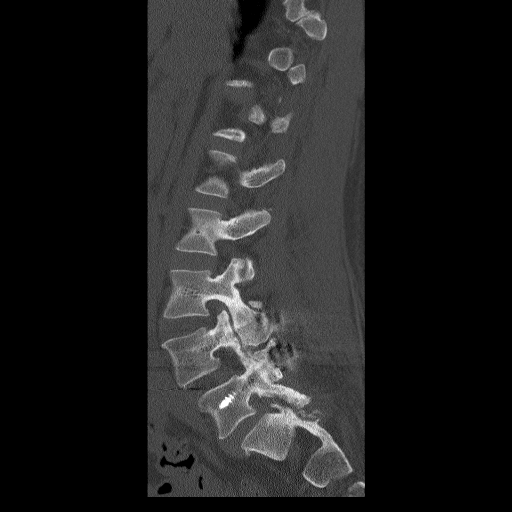
[im 25/60  bone]
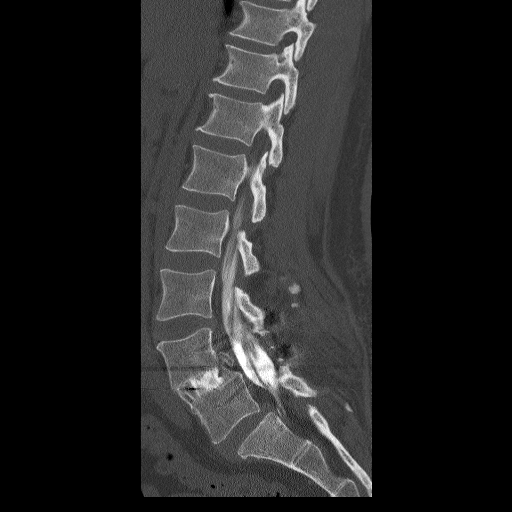
[im 30/60  soft-tissue]
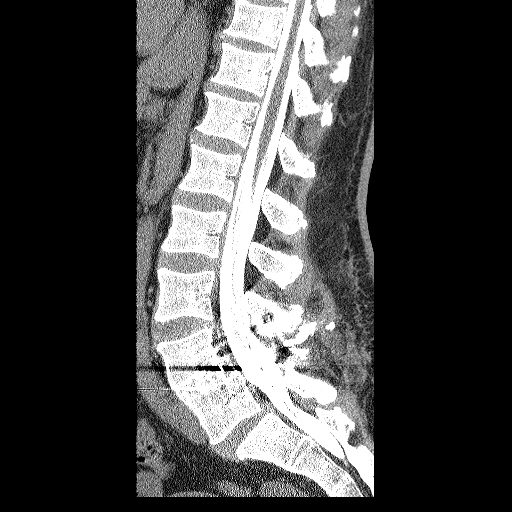
[im 30/60  bone]
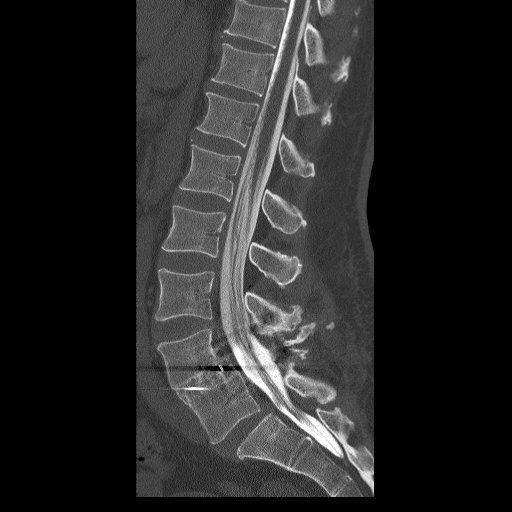
[im 35/60  bone]
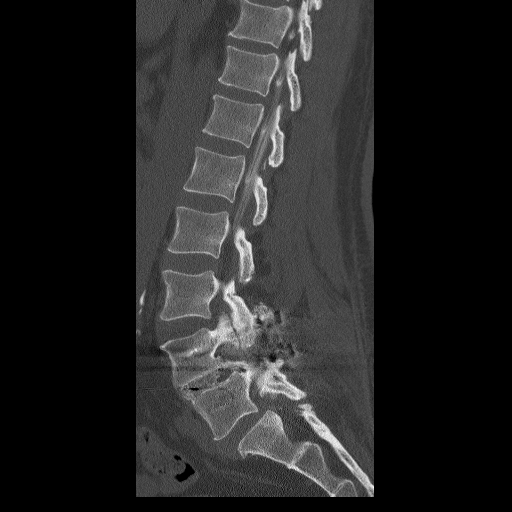
[im 40/60  bone]
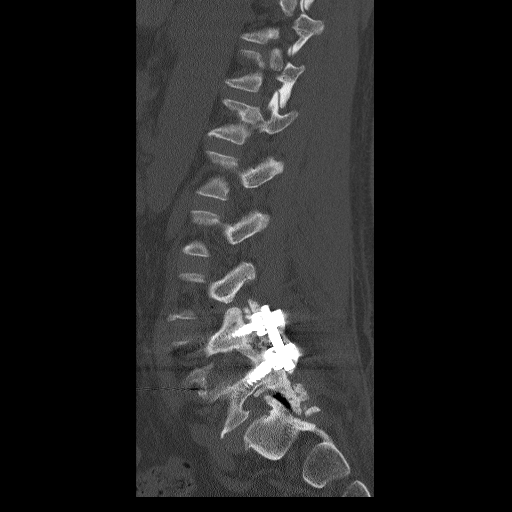

[11 of 33 positions shown; findings below may reference images not displayed]

EXAM:
LUMBAR MYELOGRAM

FLUOROSCOPY TIME:  2 minutes and 36 seconds.  60 dgy cm2

PROCEDURE:
After thorough discussion of risks and benefits of the procedure
including bleeding, infection, injury to nerves, blood vessels,
adjacent structures as well as headache and CSF leak, written and
oral informed consent was obtained. Consent was obtained by Dr.
Tereza Maximus. Time out form was completed.

Patient was positioned prone on the fluoroscopy table. Local
anesthesia was provided with 1% lidocaine without epinephrine after
prepped and draped in the usual sterile fashion. Puncture was
performed at L3-4 using a 3 1/2 inch 22-gauge spinal needle via left
paramedian approach. Using a single pass through the dura, the
needle was placed within the thecal sac, with return of clear CSF.
14 cc of [id number] was injected into the thecal sac, with normal
opacification of the nerve roots and cauda equina consistent with
free flow within the subarachnoid space.

I personally performed the lumbar puncture and administered the
intrathecal contrast. I also personally supervised acquisition of
the myelogram images.
FINDINGS: LUMBAR MYELOGRAM FINDINGS:

Prior fusion L4-5 with bilateral pedicle screws and posterior
connecting bar place. L4 vertebral body with mild anterior
spondylolisthesis. Post laminectomy with prominent thecal sac
without significant spinal stenosis at this level.

Mild anterior spondylolisthesis L5. This does not change between
flexion and extension.

Mild underfilling of the L5 nerve roots.

CT LUMBAR MYELOGRAM FINDINGS:

Last fully open disk space is labeled L5-S1. Present examination
incorporates from T9-10 through L3 level.

Conus lower L1 level.

Mild calcified plaque aorta otherwise visualized paravertebral
structures unremarkable.

T9-10:  Negative.

T10-11: Negative.

T11-12: Shallow left posterior lateral protrusion with slight
indentation left ventral thecal sac

T12-L1:  Negative.

L1-2:  Negative.

L2-3:  Negative.

L3-4: Mild to moderate facet joint degenerative changes. No
significant spinal stenosis or foraminal narrowing.

L4-5: Prior laminectomy and fusion with the L4 vertebral body fused
7 mm in anterior listhesis. Solid bony fusion across the disc space
and posterior elements. Bilateral pedicle screws and posterior
connecting bar place without complication. Bony spur posterior
central aspect mid to lower L4 level. Spur contributes to mild
bilateral foraminal narrowing greater on the left.

L5-S1: Prominent bilateral facet joint degenerative changes.
Ligamentum flavum hypertrophy. 4.1 mm anterior slip of L5. Bulge
with spur extending into the neural foramen bilaterally with
moderate bilateral foraminal narrowing and encroachment on the
exiting L5 nerve roots slightly greater on the right. Central aspect
of bulge causes very mild impression upon the ventral aspect of
thecal sac.

Mild sacroiliac joint degenerative changes.
IMPRESSION: LUMBAR MYELOGRAM IMPRESSION:

Prior laminectomy and fusion L4-5 without significant spinal
stenosis at this level.

Mild anterior spondylolisthesis L5. This does not change between
flexion and extension.

Mild underfilling of the L5 nerve roots.  Please see below.

CT LUMBAR MYELOGRAM IMPRESSION:

L5-S1 multifactorial moderate bilateral foraminal narrowing and
encroachment on the exiting L5 nerve roots slightly greater on the
right. Central aspect of bulge causes very mild impression upon the
ventral aspect of thecal sac.

L4-5 prior laminectomy and fusion with the L4 vertebral body fused 7
mm in anterior listhesis. Spur contributes to mild bilateral
foraminal narrowing greater on the left.

L3-4 mild to moderate bilateral facet joint degenerative changes
without spinal stenosis or foraminal narrowing.

T11-12 shallow left posterior lateral protrusion with slight
indentation left ventral thecal sac.
